# Patient Record
Sex: Female | Born: 1945 | Race: Black or African American | Hispanic: No | Marital: Single
Health system: Southern US, Community
[De-identification: ages and names within clinical notes are randomized; demographics above are authoritative.]

---

## 2004-02-29 ENCOUNTER — Other Ambulatory Visit: Payer: Self-pay

## 2004-03-19 ENCOUNTER — Emergency Department: Payer: Self-pay | Admitting: Emergency Medicine

## 2004-03-19 ENCOUNTER — Other Ambulatory Visit: Payer: Self-pay

## 2004-07-01 ENCOUNTER — Ambulatory Visit: Payer: Self-pay | Admitting: Family Medicine

## 2004-07-02 ENCOUNTER — Ambulatory Visit: Payer: Self-pay | Admitting: *Deleted

## 2004-07-03 ENCOUNTER — Ambulatory Visit: Payer: Self-pay | Admitting: Family Medicine

## 2004-07-14 ENCOUNTER — Ambulatory Visit: Payer: Self-pay | Admitting: Family Medicine

## 2004-07-17 ENCOUNTER — Ambulatory Visit: Payer: Self-pay | Admitting: Family Medicine

## 2004-07-21 ENCOUNTER — Ambulatory Visit: Payer: Self-pay | Admitting: Family Medicine

## 2004-07-30 ENCOUNTER — Ambulatory Visit: Payer: Self-pay | Admitting: Family Medicine

## 2004-08-04 ENCOUNTER — Ambulatory Visit: Payer: Self-pay | Admitting: Family Medicine

## 2004-08-07 ENCOUNTER — Ambulatory Visit: Payer: Self-pay | Admitting: Family Medicine

## 2004-08-12 ENCOUNTER — Emergency Department: Payer: Self-pay | Admitting: Emergency Medicine

## 2004-08-18 ENCOUNTER — Ambulatory Visit: Payer: Self-pay | Admitting: Family Medicine

## 2004-08-21 ENCOUNTER — Ambulatory Visit: Payer: Self-pay | Admitting: Family Medicine

## 2004-08-21 ENCOUNTER — Emergency Department: Payer: Self-pay | Admitting: Emergency Medicine

## 2004-08-25 ENCOUNTER — Encounter: Admission: RE | Admit: 2004-08-25 | Discharge: 2004-10-22 | Payer: Self-pay | Admitting: Family Medicine

## 2004-09-09 ENCOUNTER — Ambulatory Visit: Payer: Self-pay | Admitting: Family Medicine

## 2004-09-11 ENCOUNTER — Ambulatory Visit: Payer: Self-pay | Admitting: Family Medicine

## 2004-09-15 ENCOUNTER — Ambulatory Visit: Payer: Self-pay | Admitting: Family Medicine

## 2004-09-21 ENCOUNTER — Emergency Department: Payer: Self-pay | Admitting: Emergency Medicine

## 2004-09-24 ENCOUNTER — Ambulatory Visit (HOSPITAL_COMMUNITY): Admission: RE | Admit: 2004-09-24 | Discharge: 2004-09-24 | Payer: Self-pay | Admitting: Family Medicine

## 2004-09-24 ENCOUNTER — Ambulatory Visit: Payer: Self-pay | Admitting: Family Medicine

## 2004-09-29 ENCOUNTER — Ambulatory Visit: Payer: Self-pay | Admitting: Family Medicine

## 2004-10-13 ENCOUNTER — Ambulatory Visit: Payer: Self-pay | Admitting: Family Medicine

## 2004-10-23 ENCOUNTER — Ambulatory Visit: Payer: Self-pay | Admitting: Family Medicine

## 2004-10-28 ENCOUNTER — Ambulatory Visit: Payer: Self-pay | Admitting: Family Medicine

## 2004-10-30 ENCOUNTER — Ambulatory Visit: Payer: Self-pay | Admitting: Family Medicine

## 2004-11-10 ENCOUNTER — Encounter: Admission: RE | Admit: 2004-11-10 | Discharge: 2004-12-23 | Payer: Self-pay | Admitting: Family Medicine

## 2004-11-11 ENCOUNTER — Ambulatory Visit: Payer: Self-pay | Admitting: Family Medicine

## 2004-11-19 ENCOUNTER — Emergency Department: Payer: Self-pay | Admitting: Emergency Medicine

## 2004-11-20 ENCOUNTER — Ambulatory Visit: Payer: Self-pay | Admitting: Family Medicine

## 2004-11-25 ENCOUNTER — Ambulatory Visit: Payer: Self-pay | Admitting: Family Medicine

## 2004-12-04 ENCOUNTER — Ambulatory Visit: Payer: Self-pay | Admitting: Family Medicine

## 2004-12-08 ENCOUNTER — Emergency Department: Payer: Self-pay | Admitting: General Practice

## 2004-12-24 ENCOUNTER — Ambulatory Visit: Payer: Self-pay | Admitting: Family Medicine

## 2004-12-25 ENCOUNTER — Ambulatory Visit: Payer: Self-pay | Admitting: Family Medicine

## 2005-01-07 ENCOUNTER — Ambulatory Visit: Payer: Self-pay | Admitting: Family Medicine

## 2005-03-12 ENCOUNTER — Ambulatory Visit: Payer: Self-pay | Admitting: Family Medicine

## 2005-06-22 ENCOUNTER — Emergency Department: Payer: Self-pay | Admitting: Unknown Physician Specialty

## 2005-07-29 ENCOUNTER — Other Ambulatory Visit: Payer: Self-pay

## 2005-07-29 ENCOUNTER — Emergency Department: Payer: Self-pay | Admitting: Emergency Medicine

## 2005-11-29 ENCOUNTER — Inpatient Hospital Stay: Payer: Self-pay | Admitting: Internal Medicine

## 2005-11-29 ENCOUNTER — Other Ambulatory Visit: Payer: Self-pay

## 2005-12-01 ENCOUNTER — Other Ambulatory Visit: Payer: Self-pay

## 2006-05-26 ENCOUNTER — Emergency Department: Payer: Self-pay | Admitting: Emergency Medicine

## 2006-05-26 ENCOUNTER — Other Ambulatory Visit: Payer: Self-pay

## 2006-06-09 ENCOUNTER — Ambulatory Visit: Payer: Self-pay | Admitting: Unknown Physician Specialty

## 2006-06-17 ENCOUNTER — Inpatient Hospital Stay: Payer: Self-pay | Admitting: Internal Medicine

## 2006-06-17 ENCOUNTER — Other Ambulatory Visit: Payer: Self-pay

## 2006-06-28 ENCOUNTER — Emergency Department: Payer: Self-pay | Admitting: Unknown Physician Specialty

## 2006-06-28 ENCOUNTER — Other Ambulatory Visit: Payer: Self-pay

## 2006-10-19 ENCOUNTER — Emergency Department: Payer: Self-pay | Admitting: Emergency Medicine

## 2006-12-13 ENCOUNTER — Emergency Department: Payer: Self-pay | Admitting: Unknown Physician Specialty

## 2006-12-13 ENCOUNTER — Other Ambulatory Visit: Payer: Self-pay

## 2007-02-08 ENCOUNTER — Other Ambulatory Visit: Payer: Self-pay

## 2007-02-08 ENCOUNTER — Inpatient Hospital Stay: Payer: Self-pay | Admitting: Internal Medicine

## 2007-04-26 ENCOUNTER — Emergency Department: Payer: Self-pay | Admitting: Emergency Medicine

## 2007-05-30 ENCOUNTER — Ambulatory Visit: Payer: Self-pay | Admitting: Internal Medicine

## 2007-07-31 ENCOUNTER — Emergency Department: Payer: Self-pay | Admitting: Emergency Medicine

## 2007-11-16 ENCOUNTER — Other Ambulatory Visit: Payer: Self-pay

## 2007-11-16 ENCOUNTER — Emergency Department: Payer: Self-pay | Admitting: Internal Medicine

## 2007-12-02 ENCOUNTER — Emergency Department: Payer: Self-pay | Admitting: Emergency Medicine

## 2007-12-06 ENCOUNTER — Ambulatory Visit: Payer: Self-pay | Admitting: Internal Medicine

## 2008-01-03 ENCOUNTER — Ambulatory Visit: Payer: Self-pay | Admitting: Internal Medicine

## 2008-04-18 ENCOUNTER — Ambulatory Visit: Payer: Self-pay | Admitting: Internal Medicine

## 2008-08-27 ENCOUNTER — Ambulatory Visit: Payer: Self-pay | Admitting: Internal Medicine

## 2008-09-02 ENCOUNTER — Ambulatory Visit: Payer: Self-pay | Admitting: Internal Medicine

## 2008-10-27 ENCOUNTER — Emergency Department: Payer: Self-pay | Admitting: Emergency Medicine

## 2008-11-06 ENCOUNTER — Encounter: Payer: Self-pay | Admitting: Family Medicine

## 2008-11-13 ENCOUNTER — Encounter: Payer: Self-pay | Admitting: Family Medicine

## 2008-12-05 ENCOUNTER — Emergency Department: Payer: Self-pay | Admitting: Emergency Medicine

## 2008-12-12 ENCOUNTER — Encounter: Payer: Self-pay | Admitting: Family Medicine

## 2009-06-25 ENCOUNTER — Ambulatory Visit: Payer: Self-pay | Admitting: Family Medicine

## 2009-10-02 ENCOUNTER — Ambulatory Visit: Payer: Self-pay | Admitting: Family Medicine

## 2009-10-20 ENCOUNTER — Ambulatory Visit: Payer: Self-pay | Admitting: Family Medicine

## 2010-02-02 ENCOUNTER — Encounter: Payer: Self-pay | Admitting: Family Medicine

## 2010-02-12 ENCOUNTER — Encounter: Payer: Self-pay | Admitting: Family Medicine

## 2010-03-14 ENCOUNTER — Encounter: Payer: Self-pay | Admitting: Family Medicine

## 2010-06-11 ENCOUNTER — Ambulatory Visit: Payer: Self-pay | Admitting: Family Medicine

## 2010-10-27 ENCOUNTER — Ambulatory Visit: Payer: Self-pay | Admitting: Emergency Medicine

## 2010-11-03 ENCOUNTER — Ambulatory Visit: Payer: Self-pay | Admitting: Family Medicine

## 2011-06-16 ENCOUNTER — Inpatient Hospital Stay: Payer: Self-pay | Admitting: Internal Medicine

## 2011-06-16 LAB — CK TOTAL AND CKMB (NOT AT ARMC): CK-MB: 0.9 ng/mL (ref 0.5–3.6)

## 2011-06-16 LAB — CBC
HGB: 14 g/dL (ref 12.0–16.0)
MCH: 32 pg (ref 26.0–34.0)
MCHC: 33.7 g/dL (ref 32.0–36.0)
Platelet: 262 10*3/uL (ref 150–440)
RBC: 4.37 10*6/uL (ref 3.80–5.20)

## 2011-06-16 LAB — COMPREHENSIVE METABOLIC PANEL
Anion Gap: 12 (ref 7–16)
Calcium, Total: 9.8 mg/dL (ref 8.5–10.1)
Chloride: 91 mmol/L — ABNORMAL LOW (ref 98–107)
Co2: 27 mmol/L (ref 21–32)
EGFR (African American): 49 — ABNORMAL LOW
EGFR (Non-African Amer.): 40 — ABNORMAL LOW
Potassium: 5.1 mmol/L (ref 3.5–5.1)
SGOT(AST): 85 U/L — ABNORMAL HIGH (ref 15–37)
SGPT (ALT): 34 U/L
Total Protein: 8.5 g/dL — ABNORMAL HIGH (ref 6.4–8.2)

## 2011-06-16 LAB — PROTIME-INR
INR: 1
Prothrombin Time: 13.4 secs (ref 11.5–14.7)

## 2011-06-16 LAB — TROPONIN I: Troponin-I: <0.02 ng/mL

## 2011-06-17 LAB — LIPID PANEL
Cholesterol: 106 mg/dL (ref 0–200)
Ldl Cholesterol, Calc: 28 mg/dL (ref 0–100)
VLDL Cholesterol, Calc: 40 mg/dL (ref 5–40)

## 2011-06-17 LAB — CBC WITH DIFFERENTIAL/PLATELET
Basophil %: 0.5 %
Eosinophil #: 0.2 10*3/uL (ref 0.0–0.7)
HCT: 35.7 % (ref 35.0–47.0)
HGB: 12.1 g/dL (ref 12.0–16.0)
Lymphocyte #: 2 10*3/uL (ref 1.0–3.6)
Lymphocyte %: 30 %
Monocyte #: 0.7 10*3/uL (ref 0.0–0.7)
Monocyte %: 10.5 %
Neutrophil %: 56.6 %
RBC: 3.79 10*6/uL — ABNORMAL LOW (ref 3.80–5.20)

## 2011-06-17 LAB — BASIC METABOLIC PANEL
Anion Gap: 9 (ref 7–16)
BUN: 21 mg/dL — ABNORMAL HIGH (ref 7–18)
Calcium, Total: 9 mg/dL (ref 8.5–10.1)
EGFR (African American): >60
EGFR (Non-African Amer.): 57 — ABNORMAL LOW
Glucose: 69 mg/dL (ref 65–99)
Osmolality: 270 (ref 275–301)
Potassium: 4.5 mmol/L (ref 3.5–5.1)

## 2011-06-17 LAB — HEMOGLOBIN A1C: Hemoglobin A1C: 6.1 % (ref 4.2–6.3)

## 2011-06-18 LAB — LIPASE, BLOOD: Lipase: 238 U/L (ref 73–393)

## 2011-06-19 LAB — COMPREHENSIVE METABOLIC PANEL
Anion Gap: 9 (ref 7–16)
BUN: 4 mg/dL — ABNORMAL LOW (ref 7–18)
Calcium, Total: 9.5 mg/dL (ref 8.5–10.1)
Chloride: 106 mmol/L (ref 98–107)
EGFR (African American): >60
EGFR (Non-African Amer.): >60
Potassium: 4.2 mmol/L (ref 3.5–5.1)
SGOT(AST): 77 U/L — ABNORMAL HIGH (ref 15–37)
SGPT (ALT): 29 U/L

## 2011-06-22 LAB — PROT IMMUNOELECTROPHORES(ARMC)

## 2011-07-01 ENCOUNTER — Inpatient Hospital Stay: Payer: Self-pay | Admitting: *Deleted

## 2011-07-01 LAB — CBC
Platelet: 214 10*3/uL (ref 150–440)
RDW: 13.7 % (ref 11.5–14.5)

## 2011-07-01 LAB — COMPREHENSIVE METABOLIC PANEL
Albumin: 3.9 g/dL (ref 3.4–5.0)
Anion Gap: 15 (ref 7–16)
BUN: 19 mg/dL — ABNORMAL HIGH (ref 7–18)
Bilirubin,Total: 0.5 mg/dL (ref 0.2–1.0)
Chloride: 97 mmol/L — ABNORMAL LOW (ref 98–107)
Creatinine: 1.41 mg/dL — ABNORMAL HIGH (ref 0.60–1.30)
EGFR (African American): 48 — ABNORMAL LOW
Glucose: 66 mg/dL (ref 65–99)
Osmolality: 274 (ref 275–301)
Potassium: 5.1 mmol/L (ref 3.5–5.1)
Sodium: 137 mmol/L (ref 136–145)
Total Protein: 7.4 g/dL (ref 6.4–8.2)

## 2011-07-01 LAB — URINALYSIS, COMPLETE
Bilirubin,UR: NEGATIVE
Hyaline Cast: 27
Ketone: NEGATIVE
Ph: 5 (ref 4.5–8.0)
RBC,UR: <1 /HPF (ref 0–5)
Specific Gravity: 1.009 (ref 1.003–1.030)
Squamous Epithelial: 1
WBC UR: <1 /HPF (ref 0–5)

## 2011-07-02 LAB — BASIC METABOLIC PANEL
Anion Gap: 10 (ref 7–16)
Calcium, Total: 9.3 mg/dL (ref 8.5–10.1)
Chloride: 99 mmol/L (ref 98–107)
Co2: 28 mmol/L (ref 21–32)
EGFR (African American): >60

## 2011-07-02 LAB — TROPONIN I: Troponin-I: <0.02 ng/mL

## 2011-07-03 LAB — BASIC METABOLIC PANEL
Anion Gap: 14 (ref 7–16)
BUN: 11 mg/dL (ref 7–18)
Co2: 24 mmol/L (ref 21–32)
Creatinine: 1.04 mg/dL (ref 0.60–1.30)
EGFR (African American): >60
Osmolality: 272 (ref 275–301)

## 2011-07-04 LAB — COMPREHENSIVE METABOLIC PANEL
Anion Gap: 14 (ref 7–16)
BUN: 10 mg/dL (ref 7–18)
Bilirubin,Total: 0.4 mg/dL (ref 0.2–1.0)
Chloride: 100 mmol/L (ref 98–107)
Creatinine: 1.07 mg/dL (ref 0.60–1.30)
EGFR (African American): >60
Potassium: 5 mmol/L (ref 3.5–5.1)
Total Protein: 6.8 g/dL (ref 6.4–8.2)

## 2011-07-05 LAB — CBC WITH DIFFERENTIAL/PLATELET
Basophil #: 0 10*3/uL (ref 0.0–0.1)
Eosinophil %: 1.7 %
HCT: 35 % (ref 35.0–47.0)
Lymphocyte %: 18.4 %
Monocyte %: 8.6 %
Platelet: 199 10*3/uL (ref 150–440)
RBC: 3.63 10*6/uL — ABNORMAL LOW (ref 3.80–5.20)
RDW: 13.5 % (ref 11.5–14.5)
WBC: 7.9 10*3/uL (ref 3.6–11.0)

## 2011-07-05 LAB — BASIC METABOLIC PANEL
Calcium, Total: 9.6 mg/dL (ref 8.5–10.1)
Chloride: 97 mmol/L — ABNORMAL LOW (ref 98–107)
EGFR (African American): >60
EGFR (Non-African Amer.): >60
Glucose: 77 mg/dL (ref 65–99)
Potassium: 4.5 mmol/L (ref 3.5–5.1)

## 2011-07-12 ENCOUNTER — Ambulatory Visit: Payer: Self-pay | Admitting: Physician Assistant

## 2011-10-13 ENCOUNTER — Ambulatory Visit: Payer: Self-pay | Admitting: Internal Medicine

## 2011-10-24 LAB — CBC
HCT: 40.8 % (ref 35.0–47.0)
MCH: 33.6 pg (ref 26.0–34.0)
MCHC: 33.5 g/dL (ref 32.0–36.0)
Platelet: 234 10*3/uL (ref 150–440)
RBC: 4.07 10*6/uL (ref 3.80–5.20)

## 2011-10-24 LAB — COMPREHENSIVE METABOLIC PANEL
Anion Gap: 16 (ref 7–16)
BUN: 19 mg/dL — ABNORMAL HIGH (ref 7–18)
Bilirubin,Total: 0.6 mg/dL (ref 0.2–1.0)
Co2: 20 mmol/L — ABNORMAL LOW (ref 21–32)
EGFR (Non-African Amer.): >60
Osmolality: 237 (ref 275–301)
SGOT(AST): 117 U/L — ABNORMAL HIGH (ref 15–37)
SGPT (ALT): 27 U/L
Sodium: 116 mmol/L — CL (ref 136–145)

## 2011-10-24 LAB — URINALYSIS, COMPLETE
Bilirubin,UR: NEGATIVE
Glucose,UR: NEGATIVE mg/dL (ref 0–75)
Ketone: NEGATIVE
Nitrite: NEGATIVE
Protein: NEGATIVE
WBC UR: 1 /HPF (ref 0–5)

## 2011-10-24 LAB — PROTIME-INR
INR: 1
Prothrombin Time: 13.5 secs (ref 11.5–14.7)

## 2011-10-25 ENCOUNTER — Inpatient Hospital Stay: Payer: Self-pay | Admitting: Internal Medicine

## 2011-10-25 LAB — URINALYSIS, COMPLETE
Bilirubin,UR: NEGATIVE
Blood: NEGATIVE
Glucose,UR: NEGATIVE mg/dL (ref 0–75)
Ketone: NEGATIVE
Nitrite: NEGATIVE
Protein: NEGATIVE

## 2011-10-25 LAB — CK TOTAL AND CKMB (NOT AT ARMC)
CK, Total: 65 U/L (ref 21–215)
CK, Total: 88 U/L (ref 21–215)
CK-MB: 1.2 ng/mL (ref 0.5–3.6)
CK-MB: 1.6 ng/mL (ref 0.5–3.6)

## 2011-10-25 LAB — PROTEIN, URINE, RANDOM: Protein, Random Urine: <5 mg/dL — ABNORMAL LOW (ref 0–12)

## 2011-10-25 LAB — CREATININE, URINE, RANDOM: Creatinine, Urine Random: 20.1 mg/dL — ABNORMAL LOW (ref 30.0–125.0)

## 2011-10-25 LAB — SODIUM: Sodium: 116 mmol/L — CL (ref 136–145)

## 2011-10-26 LAB — MAGNESIUM: Magnesium: 0.9 mg/dL — ABNORMAL LOW

## 2011-10-26 LAB — CBC WITH DIFFERENTIAL/PLATELET
Basophil %: 0.7 %
Eosinophil %: 3 %
HCT: 37.7 % (ref 35.0–47.0)
HGB: 12.6 g/dL (ref 12.0–16.0)
Lymphocyte #: 1 10*3/uL (ref 1.0–3.6)
MCH: 33.5 pg (ref 26.0–34.0)
Monocyte #: 0.6 x10 3/mm (ref 0.2–0.9)
Neutrophil #: 2.6 10*3/uL (ref 1.4–6.5)
RBC: 3.77 10*6/uL — ABNORMAL LOW (ref 3.80–5.20)

## 2011-10-26 LAB — COMPREHENSIVE METABOLIC PANEL
Alkaline Phosphatase: 154 U/L — ABNORMAL HIGH (ref 50–136)
Anion Gap: 16 (ref 7–16)
BUN: 16 mg/dL (ref 7–18)
Bilirubin,Total: 0.7 mg/dL (ref 0.2–1.0)
Calcium, Total: 8.5 mg/dL (ref 8.5–10.1)
Chloride: 84 mmol/L — ABNORMAL LOW (ref 98–107)
Co2: 21 mmol/L (ref 21–32)
EGFR (Non-African Amer.): >60
Potassium: 4.7 mmol/L (ref 3.5–5.1)
SGPT (ALT): 27 U/L
Sodium: 121 mmol/L — ABNORMAL LOW (ref 136–145)

## 2011-10-26 LAB — LIPID PANEL
Cholesterol: 117 mg/dL (ref 0–200)
HDL Cholesterol: 53 mg/dL (ref 40–60)
Ldl Cholesterol, Calc: 30 mg/dL (ref 0–100)
Triglycerides: 172 mg/dL (ref 0–200)
VLDL Cholesterol, Calc: 34 mg/dL (ref 5–40)

## 2011-10-26 LAB — LIPASE, BLOOD: Lipase: 2260 U/L — ABNORMAL HIGH (ref 73–393)

## 2011-10-26 LAB — HEMOGLOBIN A1C: Hemoglobin A1C: 4.5 % (ref 4.2–6.3)

## 2011-10-27 LAB — BASIC METABOLIC PANEL
Calcium, Total: 8.5 mg/dL (ref 8.5–10.1)
Chloride: 86 mmol/L — ABNORMAL LOW (ref 98–107)
EGFR (African American): >60
EGFR (Non-African Amer.): >60
Glucose: 44 mg/dL — ABNORMAL LOW (ref 65–99)
Osmolality: 243 (ref 275–301)
Potassium: 5.2 mmol/L — ABNORMAL HIGH (ref 3.5–5.1)

## 2011-10-27 LAB — LIPASE, BLOOD: Lipase: 1134 U/L — ABNORMAL HIGH (ref 73–393)

## 2011-10-28 LAB — BASIC METABOLIC PANEL
BUN: 11 mg/dL (ref 7–18)
Chloride: 84 mmol/L — ABNORMAL LOW (ref 98–107)
EGFR (African American): >60
Potassium: 5.1 mmol/L (ref 3.5–5.1)

## 2011-10-28 LAB — MAGNESIUM: Magnesium: 1.6 mg/dL — ABNORMAL LOW

## 2011-10-29 LAB — BASIC METABOLIC PANEL
Anion Gap: 18 — ABNORMAL HIGH (ref 7–16)
BUN: 9 mg/dL (ref 7–18)
Chloride: 85 mmol/L — ABNORMAL LOW (ref 98–107)
Creatinine: 0.74 mg/dL (ref 0.60–1.30)
EGFR (Non-African Amer.): >60
Osmolality: 242 (ref 275–301)
Potassium: 5.4 mmol/L — ABNORMAL HIGH (ref 3.5–5.1)
Sodium: 121 mmol/L — ABNORMAL LOW (ref 136–145)

## 2011-10-29 LAB — MAGNESIUM: Magnesium: 1.6 mg/dL — ABNORMAL LOW

## 2011-10-30 LAB — BASIC METABOLIC PANEL
Anion Gap: 17 — ABNORMAL HIGH (ref 7–16)
BUN: 9 mg/dL (ref 7–18)
Chloride: 86 mmol/L — ABNORMAL LOW (ref 98–107)
Co2: 19 mmol/L — ABNORMAL LOW (ref 21–32)
Creatinine: 0.81 mg/dL (ref 0.60–1.30)
EGFR (African American): >60
Glucose: 58 mg/dL — ABNORMAL LOW (ref 65–99)
Osmolality: 242 (ref 275–301)

## 2011-10-30 LAB — MAGNESIUM: Magnesium: 1.5 mg/dL — ABNORMAL LOW

## 2011-10-31 LAB — MAGNESIUM: Magnesium: 1.1 mg/dL — ABNORMAL LOW

## 2011-10-31 LAB — BASIC METABOLIC PANEL
BUN: 8 mg/dL (ref 7–18)
Chloride: 86 mmol/L — ABNORMAL LOW (ref 98–107)
EGFR (African American): >60
EGFR (Non-African Amer.): >60
Glucose: 51 mg/dL — ABNORMAL LOW (ref 65–99)

## 2011-10-31 LAB — POTASSIUM: Potassium: 5 mmol/L (ref 3.5–5.1)

## 2011-10-31 LAB — HEMOGLOBIN: HGB: 11.5 g/dL — ABNORMAL LOW (ref 12.0–16.0)

## 2011-11-12 ENCOUNTER — Inpatient Hospital Stay: Payer: Self-pay | Admitting: Internal Medicine

## 2011-11-12 LAB — PROTIME-INR: INR: 1

## 2011-11-12 LAB — CBC
MCHC: 33.8 g/dL (ref 32.0–36.0)
RBC: 3.33 10*6/uL — ABNORMAL LOW (ref 3.80–5.20)
RDW: 14.7 % — ABNORMAL HIGH (ref 11.5–14.5)
WBC: 7.1 10*3/uL (ref 3.6–11.0)

## 2011-11-12 LAB — COMPREHENSIVE METABOLIC PANEL
Alkaline Phosphatase: 157 U/L — ABNORMAL HIGH (ref 50–136)
Anion Gap: 18 — ABNORMAL HIGH (ref 7–16)
BUN: 26 mg/dL — ABNORMAL HIGH (ref 7–18)
Bilirubin,Total: 0.6 mg/dL (ref 0.2–1.0)
Calcium, Total: 10 mg/dL (ref 8.5–10.1)
Chloride: 83 mmol/L — ABNORMAL LOW (ref 98–107)
EGFR (African American): >60
EGFR (Non-African Amer.): 55 — ABNORMAL LOW
Glucose: 103 mg/dL — ABNORMAL HIGH (ref 65–99)
Osmolality: 253 (ref 275–301)
Sodium: 123 mmol/L — ABNORMAL LOW (ref 136–145)
Total Protein: 7.3 g/dL (ref 6.4–8.2)

## 2011-11-13 ENCOUNTER — Ambulatory Visit: Payer: Self-pay | Admitting: Internal Medicine

## 2011-11-13 LAB — CBC WITH DIFFERENTIAL/PLATELET
Basophil #: 0.1 10*3/uL (ref 0.0–0.1)
Basophil %: 1.2 %
Eosinophil %: 7.2 %
HCT: 35.8 % (ref 35.0–47.0)
HGB: 11.9 g/dL — ABNORMAL LOW (ref 12.0–16.0)
Lymphocyte #: 1.1 10*3/uL (ref 1.0–3.6)
MCH: 33.7 pg (ref 26.0–34.0)
MCHC: 33.2 g/dL (ref 32.0–36.0)
MCV: 101 fL — ABNORMAL HIGH (ref 80–100)
Monocyte %: 10.8 %
Neutrophil #: 4.2 10*3/uL (ref 1.4–6.5)
RBC: 3.54 10*6/uL — ABNORMAL LOW (ref 3.80–5.20)
WBC: 6.5 10*3/uL (ref 3.6–11.0)

## 2011-11-13 LAB — BASIC METABOLIC PANEL
Calcium, Total: 9.9 mg/dL (ref 8.5–10.1)
Chloride: 84 mmol/L — ABNORMAL LOW (ref 98–107)
Co2: 22 mmol/L (ref 21–32)
EGFR (African American): 59 — ABNORMAL LOW
EGFR (Non-African Amer.): 51 — ABNORMAL LOW
Osmolality: 255 (ref 275–301)
Potassium: 4 mmol/L (ref 3.5–5.1)

## 2011-11-13 LAB — LIPASE, BLOOD: Lipase: 2536 U/L — ABNORMAL HIGH (ref 73–393)

## 2011-11-14 LAB — CBC WITH DIFFERENTIAL/PLATELET
Basophil #: 0.1 10*3/uL (ref 0.0–0.1)
Basophil %: 1.2 %
Eosinophil %: 6.4 %
Lymphocyte #: 1.1 10*3/uL (ref 1.0–3.6)
Lymphocyte %: 18.9 %
MCH: 33.6 pg (ref 26.0–34.0)
MCHC: 33.6 g/dL (ref 32.0–36.0)
MCV: 100 fL (ref 80–100)
Neutrophil #: 3.8 10*3/uL (ref 1.4–6.5)
Neutrophil %: 62.4 %
RBC: 3.23 10*6/uL — ABNORMAL LOW (ref 3.80–5.20)
RDW: 14.5 % (ref 11.5–14.5)

## 2011-11-14 LAB — COMPREHENSIVE METABOLIC PANEL
Albumin: 3.1 g/dL — ABNORMAL LOW (ref 3.4–5.0)
Anion Gap: 17 — ABNORMAL HIGH (ref 7–16)
BUN: 25 mg/dL — ABNORMAL HIGH (ref 7–18)
Bilirubin,Total: 0.6 mg/dL (ref 0.2–1.0)
Chloride: 84 mmol/L — ABNORMAL LOW (ref 98–107)
Co2: 21 mmol/L (ref 21–32)
Creatinine: 0.91 mg/dL (ref 0.60–1.30)
EGFR (African American): >60
Osmolality: 248 (ref 275–301)
Potassium: 4.3 mmol/L (ref 3.5–5.1)
SGOT(AST): 126 U/L — ABNORMAL HIGH (ref 15–37)
SGPT (ALT): 27 U/L
Sodium: 122 mmol/L — ABNORMAL LOW (ref 136–145)

## 2011-11-14 LAB — HEMOGLOBIN A1C: Hemoglobin A1C: 4.2 % (ref 4.2–6.3)

## 2011-11-15 LAB — VANCOMYCIN, TROUGH: Vancomycin, Trough: 17 ug/mL (ref 10–20)

## 2011-11-15 LAB — BASIC METABOLIC PANEL
Calcium, Total: 10.1 mg/dL (ref 8.5–10.1)
Co2: 21 mmol/L (ref 21–32)
Creatinine: 0.87 mg/dL (ref 0.60–1.30)
EGFR (African American): >60
Osmolality: 250 (ref 275–301)
Sodium: 124 mmol/L — ABNORMAL LOW (ref 136–145)

## 2011-11-17 LAB — BASIC METABOLIC PANEL
BUN: 18 mg/dL (ref 7–18)
Calcium, Total: 10.5 mg/dL — ABNORMAL HIGH (ref 8.5–10.1)
Chloride: 85 mmol/L — ABNORMAL LOW (ref 98–107)
Co2: 24 mmol/L (ref 21–32)
Creatinine: 0.93 mg/dL (ref 0.60–1.30)
EGFR (African American): >60
EGFR (Non-African Amer.): >60
Glucose: 59 mg/dL — ABNORMAL LOW (ref 65–99)
Osmolality: 251 (ref 275–301)
Potassium: 4.4 mmol/L (ref 3.5–5.1)
Sodium: 125 mmol/L — ABNORMAL LOW (ref 136–145)

## 2011-11-18 LAB — VANCOMYCIN, TROUGH: Vancomycin, Trough: 22 ug/mL (ref 10–20)

## 2011-11-18 LAB — CULTURE, BLOOD (SINGLE)

## 2012-01-13 DEATH — deceased

## 2012-12-08 IMAGING — CT CT ABD-PELV W/ CM
1 of 3 series · 13 of 32 positions shown, 18 images · non-contrast
Comparison: none

REASON FOR EXAM: (1) Abdominal pain; (2) Mesenteric ischemia
COMMENTS:

[Series 2: 3mm soft tissue · axial · 0.78mm/px · z∈[-950,-581]mm · 13 of 141 slices shown, 18 images]
[im 9/141  soft-tissue]
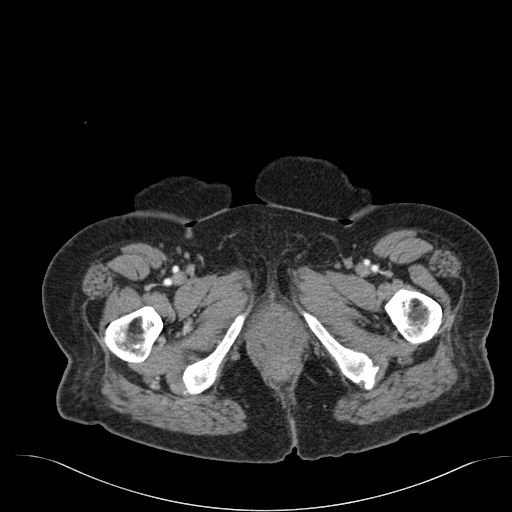
[im 9/141  bone]
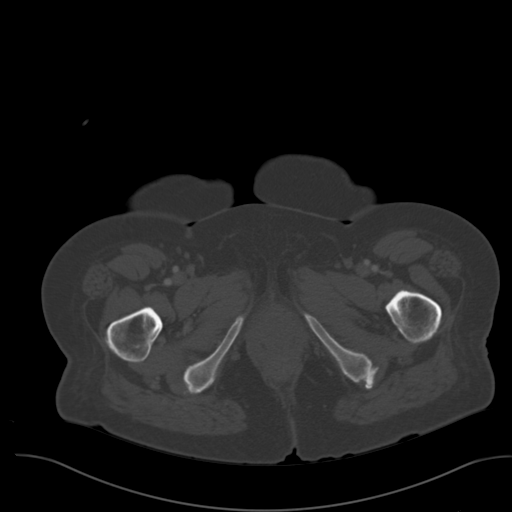
[im 25/141  soft-tissue]
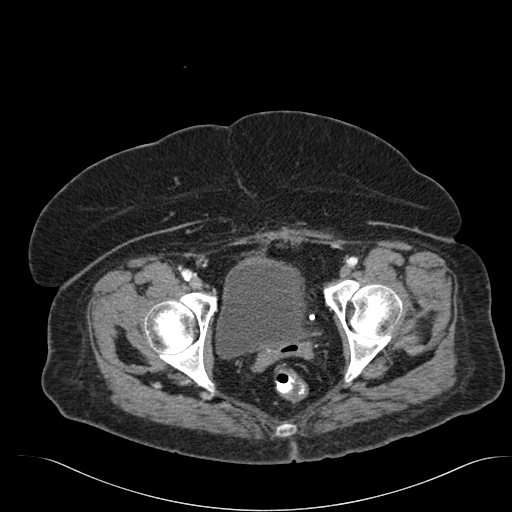
[im 33/141  soft-tissue]
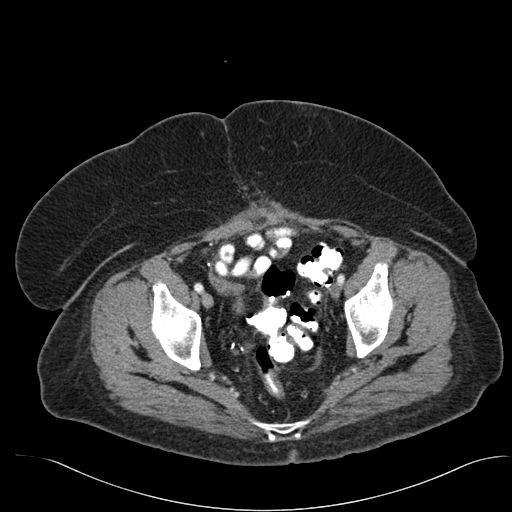
[im 42/141  soft-tissue]
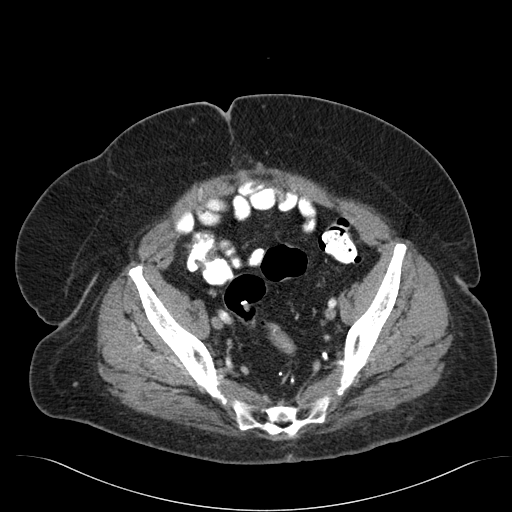
[im 58/141  soft-tissue]
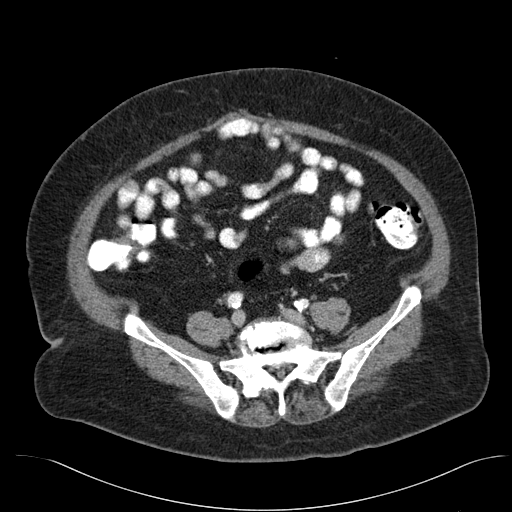
[im 66/141  soft-tissue]
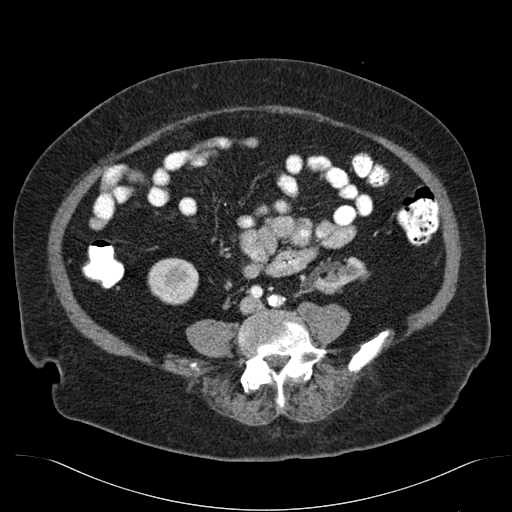
[im 75/141  soft-tissue]
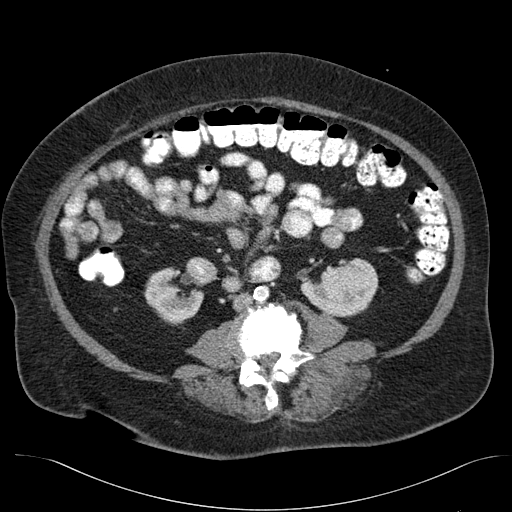
[im 91/141  soft-tissue]
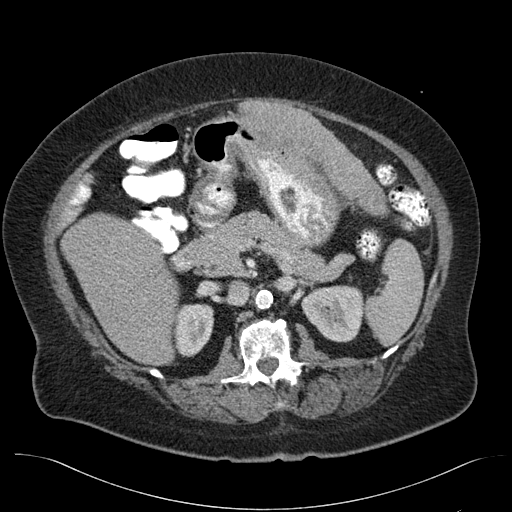
[im 99/141  soft-tissue]
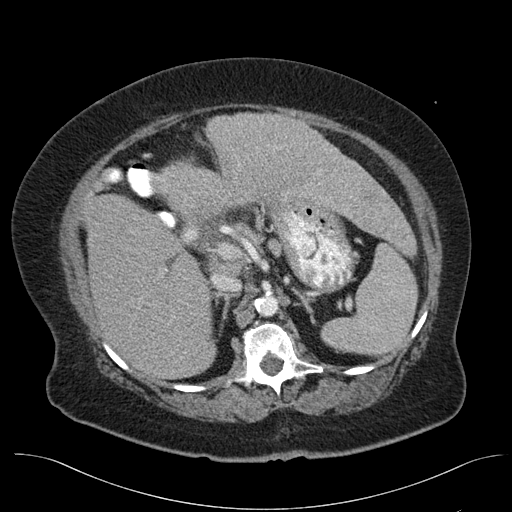
[im 99/141  bone]
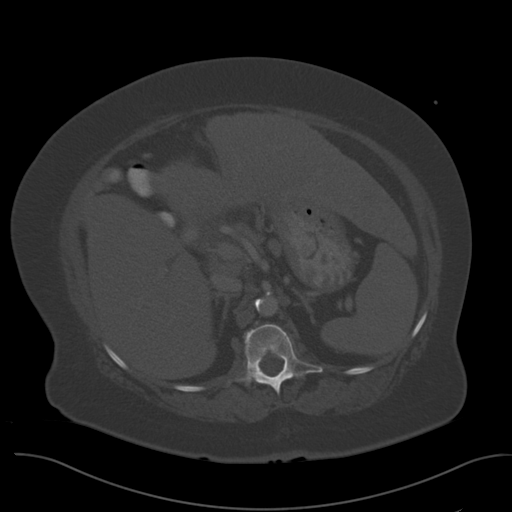
[im 108/141  soft-tissue]
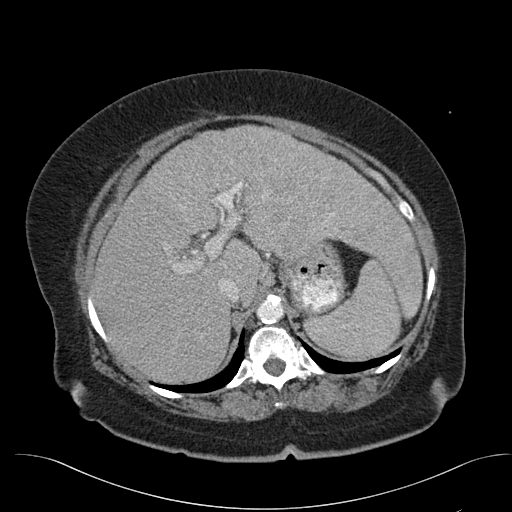
[im 108/141  lung]
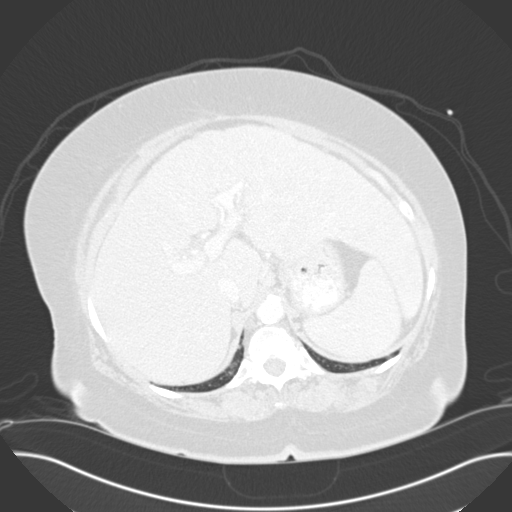
[im 116/141  lung]
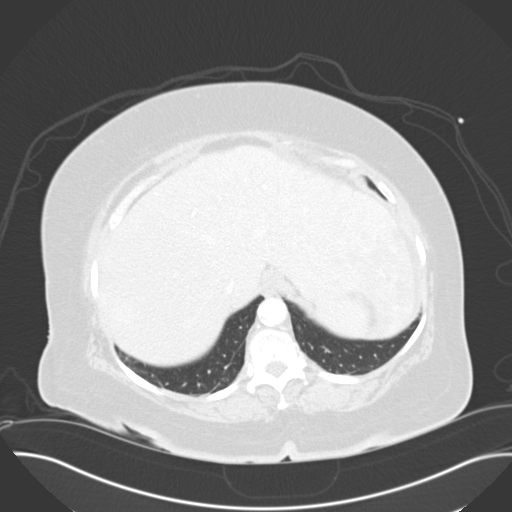
[im 124/141  soft-tissue]
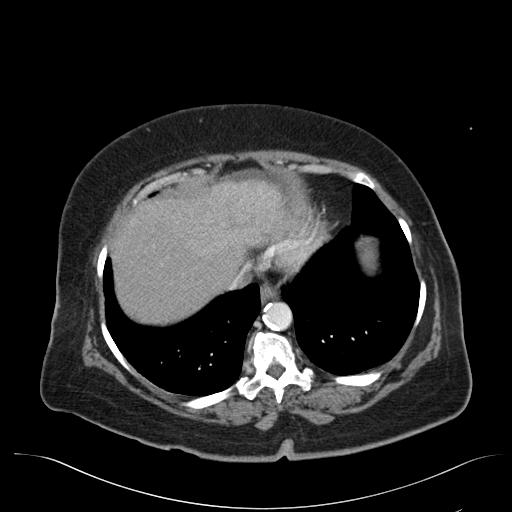
[im 124/141  lung]
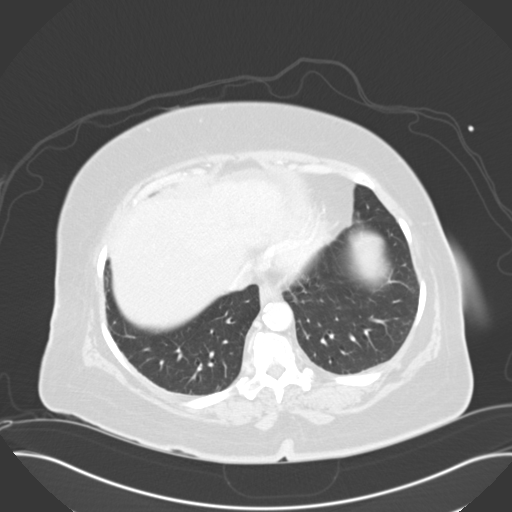
[im 132/141  soft-tissue]
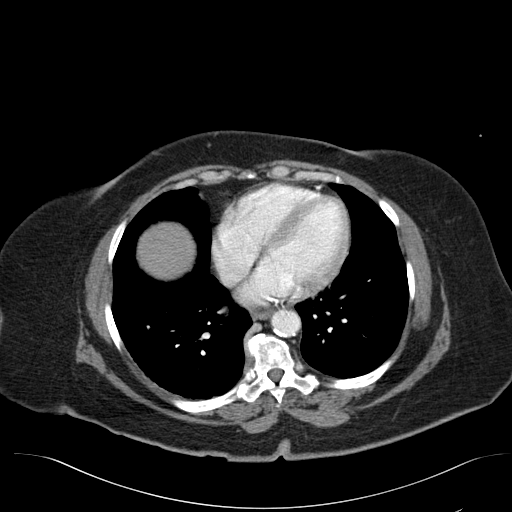
[im 132/141  lung]
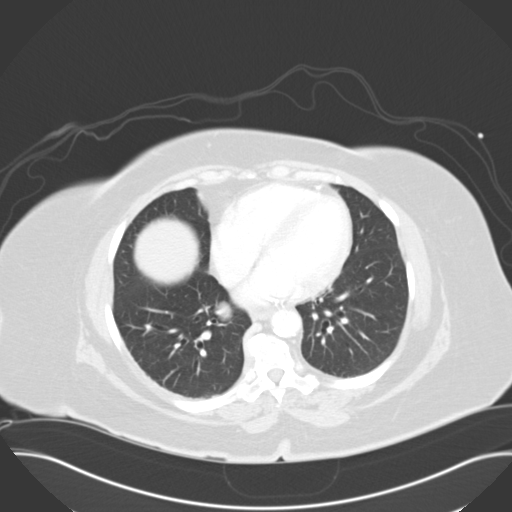

[13 of 32 positions shown; findings below may reference images not displayed]

PROCEDURE:     CT  - CT ABDOMEN / PELVIS  W  - June 19, 2011  [DATE]

RESULT:     CT of the abdomen and pelvis is performed utilizing 100 mL of
[id number]-K87 iodinated intravenous contrast. The patient received oral
contrast for the exam. Study does not demonstrate significant abnormal bowel
distention or bowel wall thickening. There is a heterogeneous appearance of
the left lobe of the liver superiorly on the initial images which is not
seen on the postcontrast delayed images which are more of an equilibrium
phase and portal venous phase study. To CTA technique is not utilized during
the initial injection. There is significant atherosclerotic calcification
causing severe stenosis at the aortic bifurcation and a high degree of
stenosis in the proximal common iliac artery normal left and at the origin
of the right common iliac. Prominent atherosclerotic calcification is noted
at the origin of the superior mesenteric artery causing evidence of
narrowing. Catheter directed arteriography may be beneficial. Vascular
interventional surgical consultation is suggested. There is some
atherosclerotic narrowing at the origin of the celiac artery as well,
however, this appears to be less significant and less advanced.
Atherosclerotic calcification is noted at the origin of the right renal
artery with findings concerning for stenosis. Less prominent origin
atherosclerotic calcification is seen at the origin of the left renal
artery. Degenerative changes are seen in the lumbar spine especially toward
the left. The kidneys show no discrete mass or obstruction. There does not
appear to be significant peripancreatic inflammatory stranding area there is
evidence of hepatic steatosis. The spleen is unremarkable. The adrenal
glands appear normal. No adenopathy, abnormal bowel distention or bowel wall
thickening is evident. Fat filled umbilical hernia is present. The urinary
bladder is unremarkable. Scattered sigmoid colon diverticula disease is
demonstrated. The appendix appears normal.
IMPRESSION: 1. Abnormal appearance of the left lobe of the liver on the initial images.
This is not verified on the delayed postcontrast images. Differential
considerations include hemangioma, underlying infiltrative process, although
that is felt to be somewhat less likely and asymmetric hepatic steatosis
with some areas of focal fatty sparing area there is a punctate calcific
density in the liver on image 27 which was present previously. Given the
appearance of previous study from [DATE] it is felt that there is
likely an underlying hemangioma in the subdiaphragmatic left lobe of the
liver. Ultrasound or MRI correlation is recommended.
2. No definite CT evidence of acute pancreatitis.
3. Significant atherosclerotic disease with findings concerning for common
iliac stenosis, distal aortic stenosis and superior mesenteric artery
stenosis. Right renal artery origin stenosis may also be present. Vascular
stent original surgical consultation is suggested.
4. Small fat filled periumbilical hernia.
5. The lung bases appear clear.

## 2013-04-14 IMAGING — CR DG CHEST 1V PORT
1 series · 1 of 1 positions shown · non-contrast
Comparison: none

REASON FOR EXAM: cp
COMMENTS:

[ap]
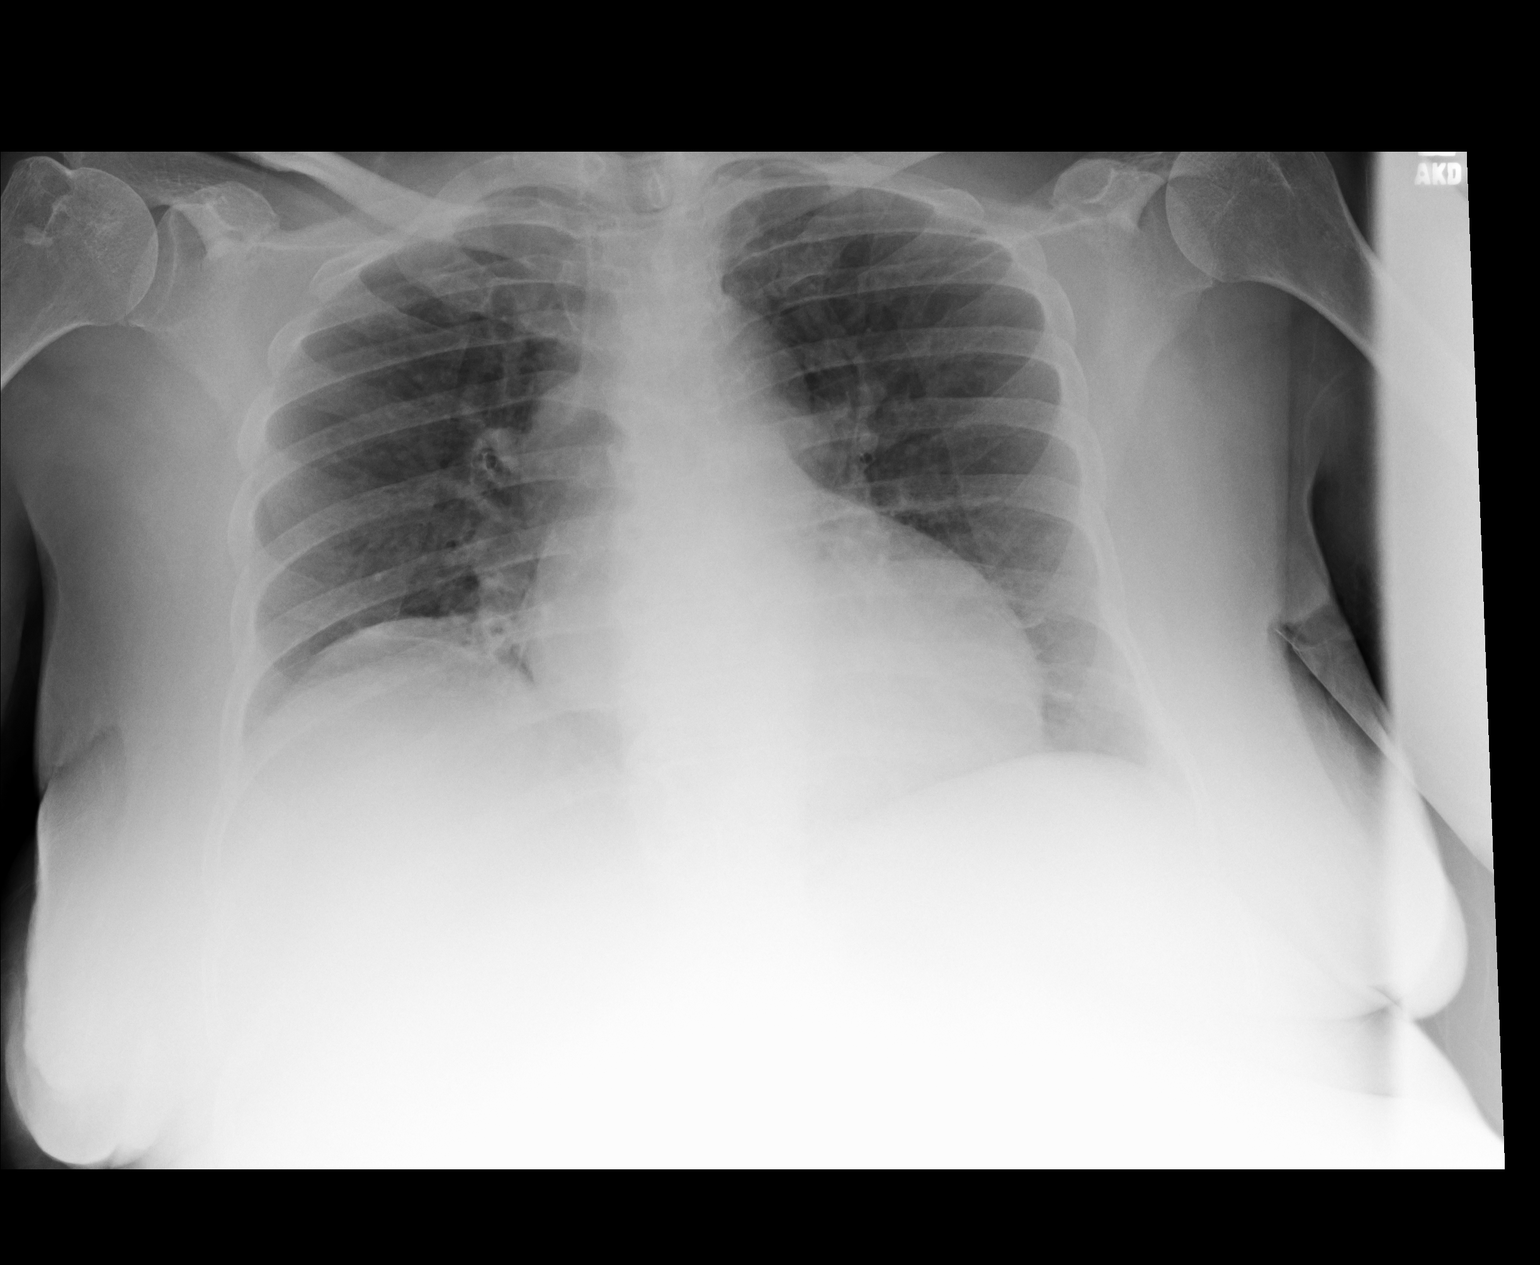

[1 of 1 positions shown; findings below may reference images not displayed]

PROCEDURE:     DXR - DXR PORTABLE CHEST SINGLE VIEW  - October 24, 2011  [DATE]

RESULT:     Comparison made to study July 01, 2011.

The right lung is mildly hypoinflated. Minimal linear density above the
hemidiaphragm is present but not entirely new. The left lung is clear. The
cardiac silhouette is mildly enlarged. The pulmonary vascularity is not
engorged.
IMPRESSION: There is atelectasis versus scarring at the right lung base
with chronic mild elevation of the hemidiaphragm. Otherwise I do not see
acute abnormality. A followup PA and lateral chest x-ray would be of value.

## 2013-04-15 IMAGING — US ABDOMEN ULTRASOUND
1 series · 17 of 25 positions shown · non-contrast
Comparison: none

REASON FOR EXAM: Abd pain/Pancreatitis
COMMENTS:

[Series 1: abdomen ultrasound · 17 of 82 slices shown]
[im 1/82]
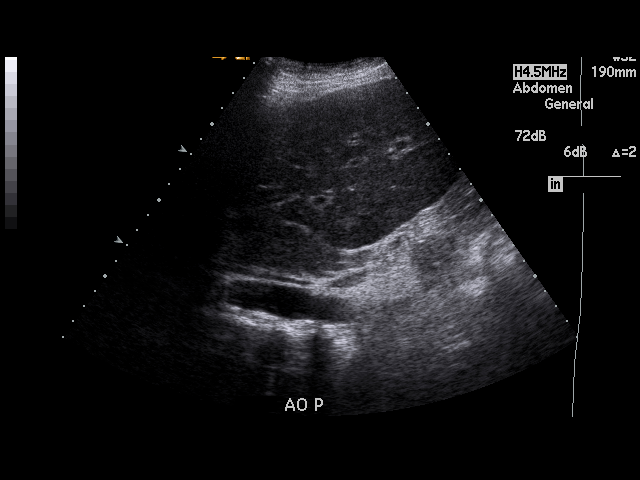
[im 7/82]
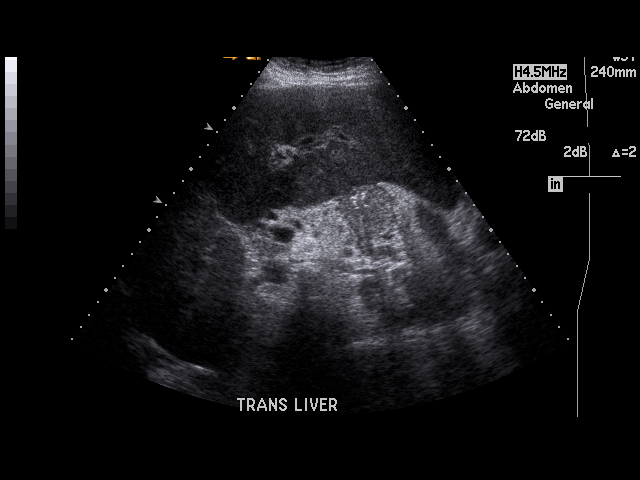
[im 11/82]
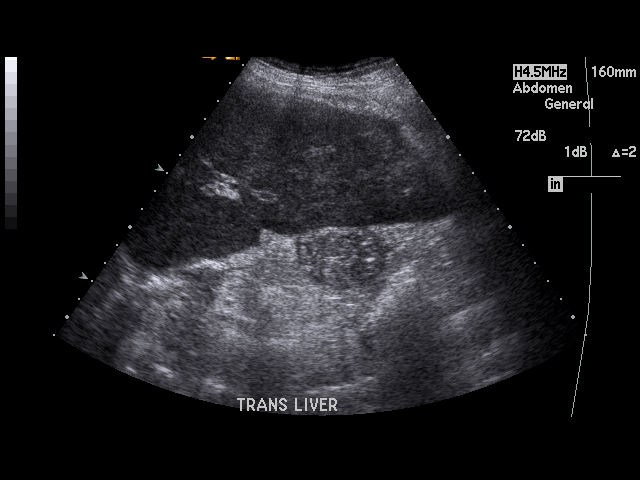
[im 17/82]
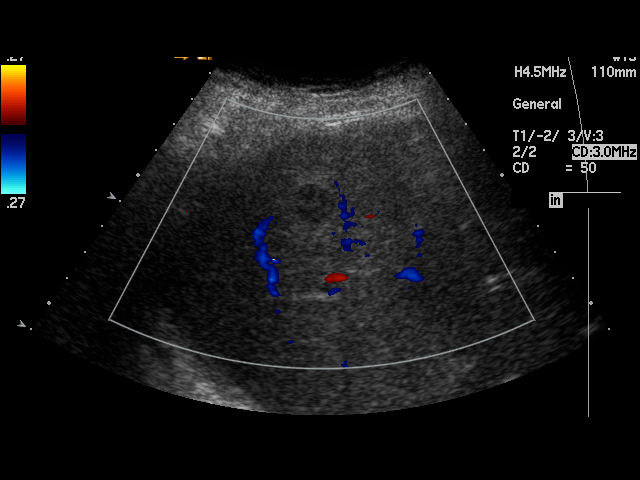
[im 21/82]
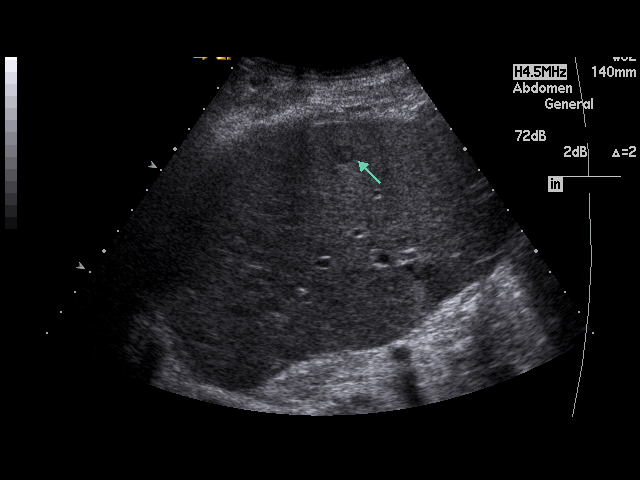
[im 28/82]
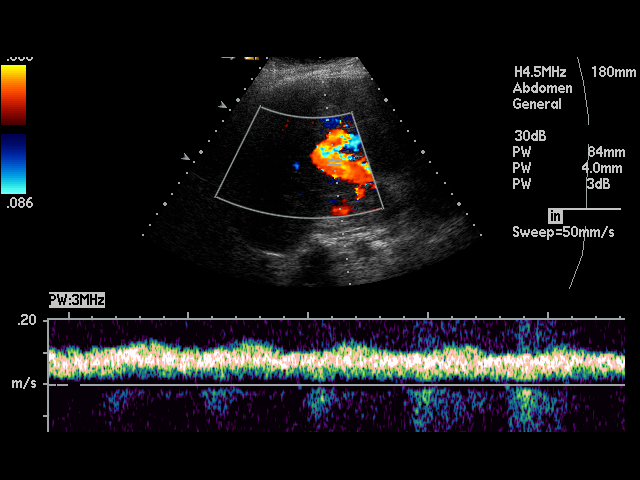
[im 31/82]
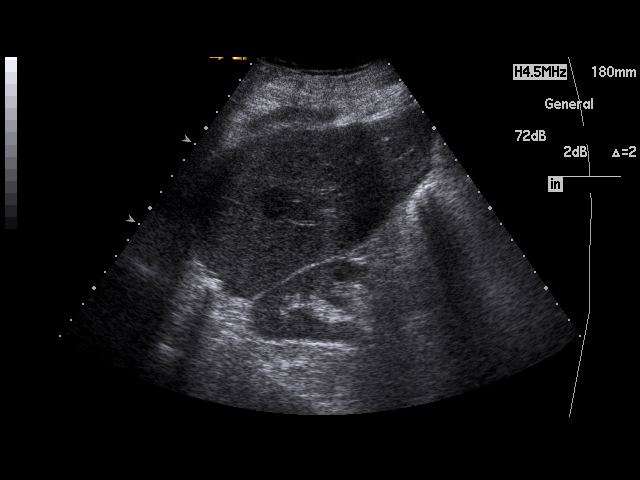
[im 38/82]
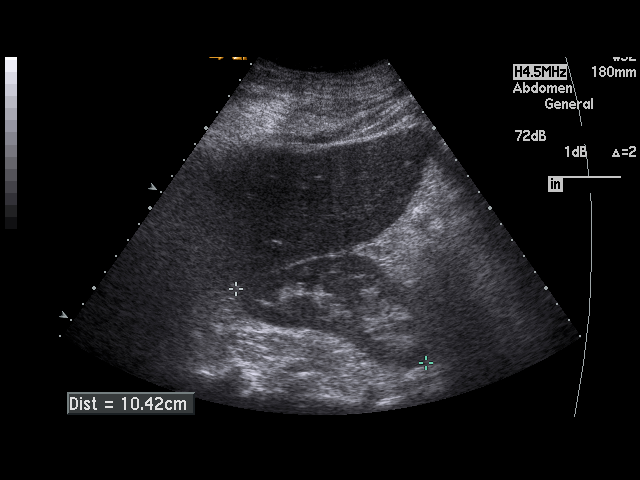
[im 41/82]
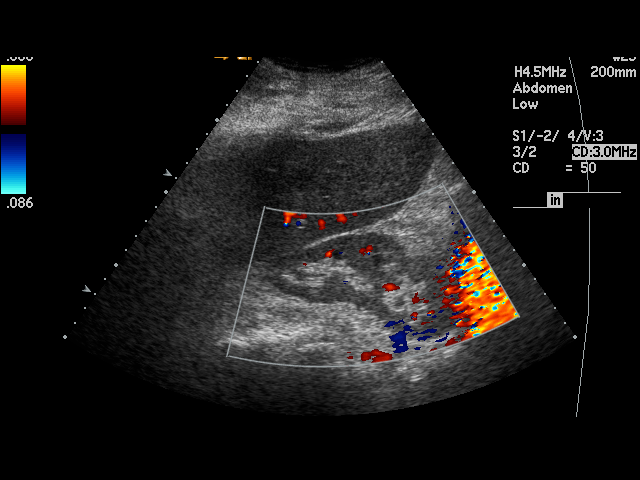
[im 44/82]
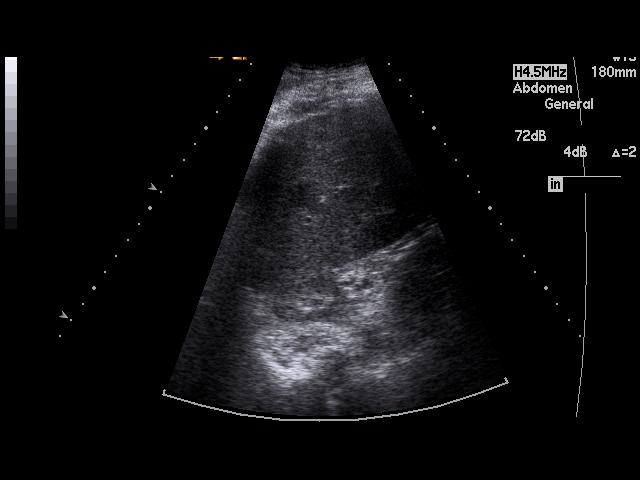
[im 51/82]
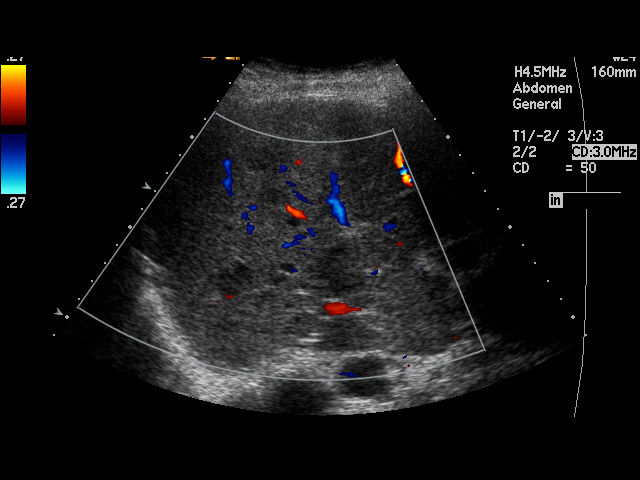
[im 55/82]
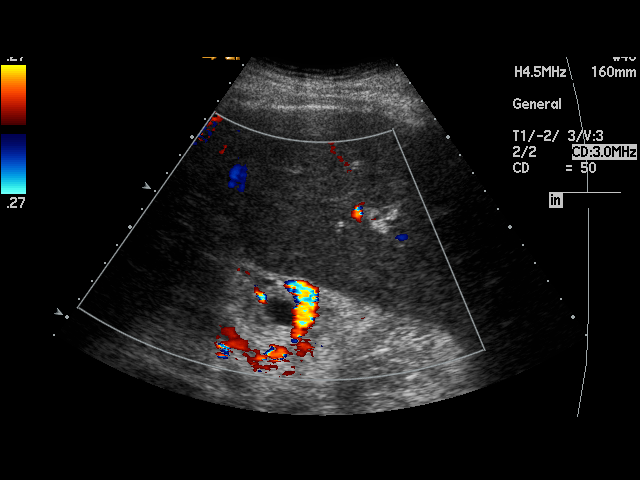
[im 61/82]
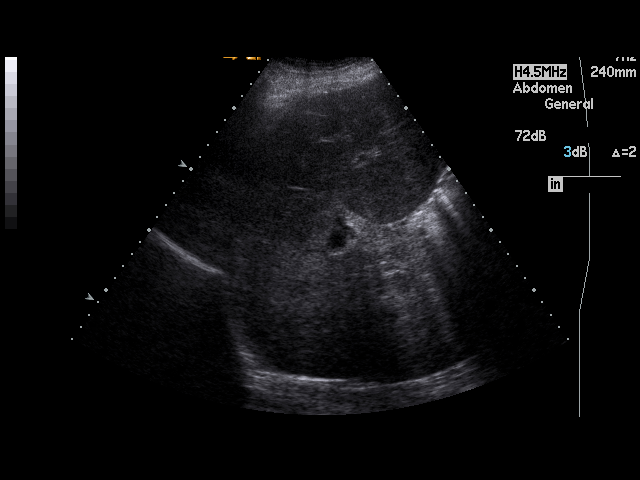
[im 65/82]
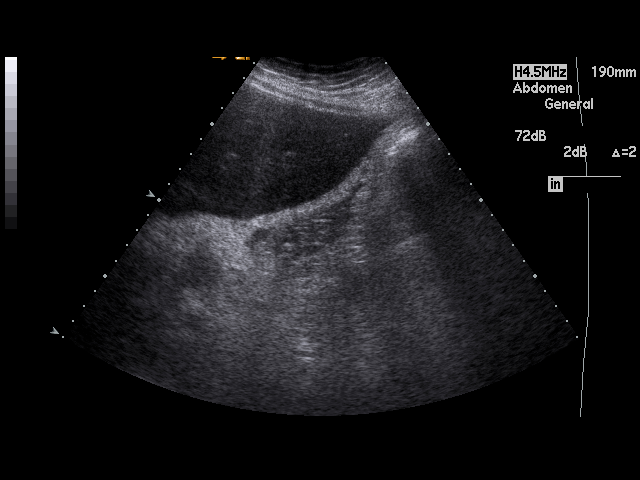
[im 71/82]
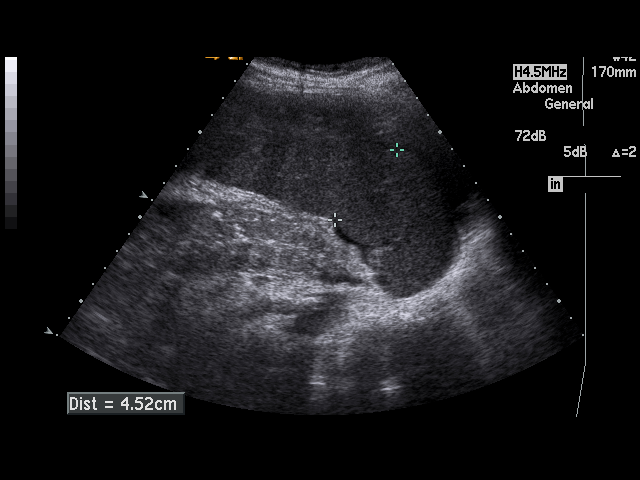
[im 75/82]
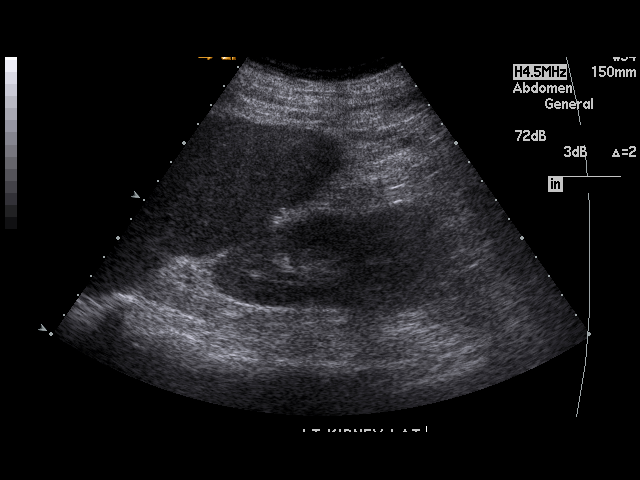
[im 82/82]
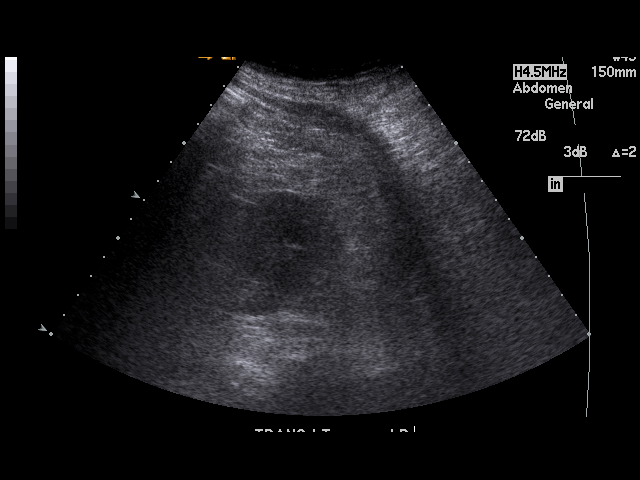

[17 of 25 positions shown; findings below may reference images not displayed]

PROCEDURE:     US  - US ABDOMEN GENERAL SURVEY  - October 25, 2011  [DATE]

RESULT:     Comparison is made to a prior exam June 19, 2011. The liver is
heterogeneous with there being observed multiple hypoechoic areas in the
right lobe of the liver. The findings are consistent with diffuse hepatic
metastatic disease. Confirmation by repeat hepatic CT or hepatic MR is
recommended. The head and body of the pancreas show no specific
abnormalities. The pancreatic tail is obscured. No pseudocyst formation or
fluid about the pancreas is seen. There is noted a small amount of ascites
about the liver. The spleen is mildly enlarged. The spleen measures 13.42 cm
at maximum diameter. On the prior exam of 06/19/2011, the spleen measures
cm in AP diameter. The abdominal aorta is not optimally visualized in this
exam but the upper portion is visualized with no significant abnormality
seen. The inferior vena cava is normal in appearance. The gallbladder is not
seen compatible with prior cholecystectomy. The common bile duct measures
3.9 mm in number which is within normal limits. The kidneys show no
hydronephrosis. Sagittally, the right kidney measures 10.42 cm and the left
measures 11.36 cm.
IMPRESSION: 1. The liver is enlarged, nonhomogeneous and shows progressive nodularity
consistent with diffuse hepatic metastatic disease.
2. There is mild enlargement of the spleen.
3. The patient has had prior cholecystectomy.
4. There is a small amount of ascites noted inferior to the liver.

## 2014-10-06 NOTE — Consult Note (Signed)
Chief Complaint:   Subjective/Chief Complaint Feels better today and tolerating full liquid diet.   VITAL SIGNS/ANCILLARY NOTES: **Vital Signs.:   17-May-13 13:37   Vital Signs Type Routine   Temperature Temperature (F) 97.9   Celsius 36.6   Temperature Source oral   Pulse Pulse 81   Pulse source per Dinamap   Respirations Respirations 18   Systolic BP Systolic BP 813   Diastolic BP (mmHg) Diastolic BP (mmHg) 66   Mean BP 89   BP Source Dinamap   Pulse Ox % Pulse Ox % 96   Pulse Ox Activity Level  At rest   Oxygen Delivery Room Air/ 21 %   Brief Assessment:   Additional Physical Exam Abdomen distended but soft and benign. Hepatomegaly was again noted.   Routine Chem:  17-May-13 03:46    Glucose, Serum 89   BUN 9   Creatinine (comp) 0.74   Sodium, Serum 121   Potassium, Serum 5.4   Chloride, Serum 85   CO2, Serum 18   Calcium (Total), Serum 8.5   Osmolality (calc) 242   eGFR (African American) >60   eGFR (Non-African American) >60   Anion Gap 18   Lipase 786   Magnesium, Serum 1.6  Blood Glucose:  17-May-13 07:27    POCT Blood Glucose 63    10:59    POCT Blood Glucose 81    16:40    POCT Blood Glucose 67   Assessment/Plan:  Assessment/Plan:   Assessment Pancreatitis. Improving. Lipase is better. Hepatic malignancy. Records from [facility] reviewed. 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 The prognosis is poor and she should consider hospice.    Plan Agree with full liquid diet. Will follow.   Electronic Signatures: Jill Side (MD)  (Signed 17-May-13 19:47)  Authored: Chief Complaint, VITAL SIGNS/ANCILLARY NOTES, Brief Assessment, Lab Results, Assessment/Plan   Last Updated: 17-May-13 19:47 by Jill Side (MD)

## 2014-10-06 NOTE — Consult Note (Signed)
History of Present Illness:   Reason for Consult Increased IgM.    HPI Patient is a 69 year old female recently admitted with pneumonia and dehydration.  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  Currently patient feels weak and fatigued, but improved since her admission.  She has no neurologic complaints.  She continues to have poor by mouth intake, but states this is improving as well.  She did not complain of nausea today.  She denies any bony pain  She has no constipation or diarrhea.  She has no urinary complaints.  Patient offers no further specific complaints today.   PFSH:   Additional Past Medical and Surgical History Past medical history: Hypertension, neuropathy, GERD, hyperlipidemia, diabetes, CAD.  Past surgical history: Hysterectomy, hernia repair, cholecystectomy.  Social history: Patient denies tobacco or alcohol.  Family history: CAD.   Review of Systems:   Performance Status (ECOG) 1    Review of Systems As per HPI. Otherwise, 10 point system review was negative.   NURSING NOTES: **Vital Signs.:   19-Jan-13 08:07    Vital Signs Type: Routine    Temperature Temperature (F): 97.8    Celsius: 36.5    Temperature Source: axillary    Pulse Pulse: 110    Pulse source: per Dinamap    Respirations Respirations: 18    Systolic BP Systolic BP: 814    Diastolic BP (mmHg) Diastolic BP (mmHg): 90    Mean BP: 107    BP Source: Dinamap    Pulse Ox % Pulse Ox %: 96    Pulse Ox Activity Level: At rest    Oxygen Delivery: Room Air/ 21 %   Physical Exam:   Physical Exam General: Well-developed, well-nourished, no acute distress. Eyes: Pink conjunctiva, anicteric sclera. HEENT: Normocephalic, moist mucous membranes, clear oropharnyx. Lungs: Clear to auscultation bilaterally. Heart: Regular rate and rhythm. No rubs, murmurs, or gallops. Abdomen: Soft, nontender, nondistended. No organomegaly noted, normoactive  bowel sounds. Musculoskeletal: No edema, cyanosis, or clubbing. Neuro: Alert, answering all questions appropriately. Cranial nerves grossly intact. Skin: No rashes or petechiae noted. Psych: Normal affect. Lymphatics: No cervical, calvicular, axillary or inguinal LAD.    Adenosine: Hives, Wheezing, Cough, Rash  Codeine: Unknown  Sulfa drugs: Unknown  PCN: Unknown  Latex: Unknown    Premarin: 0625 MG/GM crea (estrogens, conjugated vaginal) 1 gm PV nightly at bedtime 3 times per week., Active, 0, None   Norco: 7.5 - 500 mg tabs (hydrocodone - acetaminophen) 1 by mouth every 6 hrs as needed for severe pain., Active, 0, None   Singulair 10 mg oral tablet: 1 tab(s) orally once a day (at bedtime), Active, 0, None   Patanol 0.1% ophthalmic solution: 1 drop(s) to each affected eye 2 times a day, Active, 0, None   triamcinolone acetonide: 0.1 % crea. Aplly to affected area twice daily for up to 2 weeks as needed., Active, 0, None   omeprazole 20 mg oral delayed release capsule: 1 by mouth twice daily for reflux., Active, 0, None   Neurontin 300 mg oral capsule: cap(s) orally 1 in am and 1 at lunch and 2 at bedtime for nerve pain., Active, 0, None   simvastatin 80 mg oral tablet: 1 daily each evening for high cholesterol., Active, 0, None   Cozaar 100 mg oral tablet: 1 tab(s) orally once a day, Active, 0, None   furosemide 40 mg oral tablet: 1 tab(s) orally once a day every am for fluid  and high BP., Active, 0, None   Procardia XL 60 mg oral tablet, extended release: 1 tab(s) orally once a day, Active, 0, None   aspirin: enteric coated 325 mg tab 1 by mouth daily, Active, 0, None   Calcium 600+D: + D 600-400 mg unit tabs 1 by mouth twice daily for bone health., Active, 0, None   multivitamin: 1 tab(s) orally once a day, Active, 0, None   potassium chloride 10 mEq oral tablet, extended release: 1 tab(s) orally 2 times a day, Active, 0, None   Toprol-XL 50 mg oral tablet, extended  release: 1 tab(s) orally once a day, Active, 0, None   predniSONE: Taper as directed, Active, 0, None   Glucophage 500 mg oral tablet: 1 tab(s) orally 2 times a day-Restart on 06/21/11 in the evening, Active, 0, None   Zofran 4 mg oral tablet: tab(s) orally every 6 hours, As Needed- for Nausea, Vomiting , Active, 0, None  Routine Chem:  18-Jan-13 05:06    Glucose, Serum 66   BUN 16   Creatinine (comp) 0.99   Sodium, Serum 137   Potassium, Serum 4.5   Chloride, Serum 99   CO2, Serum 28   Calcium (Total), Serum 9.3   Osmolality (calc) 273   eGFR (African American) >60   eGFR (Non-African American) >60   Anion Gap 10   Assessment and Plan:   Impression Positive SIEP.    Plan 1.  Positive SIEP/elevated IgM: Patient does not have an M spike.  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  There is no evidence of underlying multiple myeloma and she is at minimal risk for progressing.    Appreciate consult, call with questions.   Electronic Signatures: Delight Hoh (MD)  (Signed 19-Jan-13 11:56)  Authored: HISTORY OF PRESENT ILLNESS, PFSH, ROS, NURSING NOTES, PE, ALLERGIES, HOME MEDICATIONS, LABS, ASSESSMENT AND PLAN   Last Updated: 19-Jan-13 11:56 by Delight Hoh (MD)

## 2014-10-06 NOTE — Consult Note (Signed)
Chief Complaint:   Subjective/Chief Complaint Overall feels better but could not tolerate solid food well. No significant abdominal pain. Lipase is still elavated but better. Pedal edema improved.  Recommendations: Liquid diet. for another 24-48 hours before trying advancing it again. Will follow.   Electronic Signatures: Lurline DelIftikhar, Deyja Sochacki (MD)  (Signed [phone]-May-13 17:05)  Authored: Chief Complaint   Last Updated: 16-May-13 17:05 by Lurline DelIftikhar, Di Jasmer (MD)

## 2014-10-06 NOTE — H&P (Signed)
PATIENT NAME:  Sandra Vaughn, Sandra Vaughn MR#:  [id number] DATE OF BIRTH:  10-Apr-1946  DATE OF ADMISSION:  06/16/2011  PRIMARY CARE PHYSICIAN: Hillery AldoSarah Amiylah Anastos, MD  EMERGENCY ROOM PHYSICIAN: Maurilio LovelyNoelle McLaurin, MD  CHIEF COMPLAINT: Nausea, vomiting, and abdominal pain.   HISTORY OF PRESENT ILLNESS: The patient is a 69 year old African American female who presents with chief complaint of abdominal pain. She describes it as dull pain. Symptoms began two months ago. The patient has been having abdominal pain off and on associated with nausea. Symptoms have been gradually getting worse. The patient was recently diagnosed with upper respiratory tract infection and was treated with antibiotics. Her nausea has not improved. She continues to have abdominal pain. She had an episode of vomiting. The patient is unable to keep anything down. She has a poor appetite.  PAST MEDICAL HISTORY:  1. Hypertension.  2. Neuropathy. 3. Gastroesophageal reflux disease. 4. Hyperlipidemia.  5. Diabetes, type 2.  6. Myocardial infarction.  7. Coronary artery disease with stent placement.  8. Back surgery. 9. Partial hysterectomy. 10. Hernia repair. 11. Cesarean section. 12. Cholecystectomy.  SOCIAL HISTORY: The patient lives alone. She denies tobacco abuse, alcohol abuse, or drug abuse.   FAMILY HISTORY: The patient's one brother had a stroke. Father died and had a myocardial infarction. Her mother died in her late 5180s and had a myocardial infarction.   REVIEW OF SYSTEMS: CONSTITUTIONAL: The patient denies any fevers, chills, or night sweats. HEENT: The patient denies any hearing loss, dysphagia, visual problems, or sore throat. CARDIOVASCULAR: The patient denies any chest pain, orthopnea, or PND. RESPIRATORY: The patient denies any cough, wheezing, or hemoptysis. GI: The patient denies any nausea, vomiting, abdominal pain, hematemesis, hematochezia, or melena. GU: The patient denies any hematuria, dysuria, or frequency. NEUROLOGIC:  The patient denies any headache, focal weakness, or seizures. SKIN: The patient denies lesions or rash. ENDOCRINE: The patient denies polyuria, polyphagia, or polydipsia. MUSCULOSKELETAL: The patient denies any arthritis, joint effusion, or swelling. HEMATOLOGIC: The patient denies any easy bleeding or bruises.   PHYSICAL EXAMINATION:   VITAL SIGNS: Temperature 95.4, heart rate 75, respiratory rate 20, blood pressure 116/58, and oxygen saturation 97%.   HEENT: Atraumatic, normocephalic. Pupils are equal, round, and reactive to light. Extraocular movements intact. Sclerae anicteric. Mucous membranes dry.   NECK: Supple. No organomegaly.   HEART:  S1 and S2 regular rate and rhythm. No gallops. No thrills. No murmurs.   LUNGS: Clear to auscultation. No rales, no rhonchi, no wheezes, and no bronchial breath sounds.   GI: Abdomen is soft, nontender, and nondistended. Normal bowel sounds. No hepatosplenomegaly.   GU: No hematuria or masses are noted.   SKIN: No lesions. No rash.   ENDOCRINE: No masses. No thyromegaly.   LYMPH: No lymphadenopathy or nodes palpable.  NEURO: Cranial nerves II through XII grossly intact.  Motor strength is 5/5 in bilateral upper and lower extremities. Sensation is within normal limits. No focal neurological deficits are noted on examination.   MUSCULOSKELETAL: No arthritis, joint effusion, or swelling.   HEMATOLOGIC: No ecchymosis, no bleeding, and no petechiae is noted.   EXTREMITIES: No cyanosis, no clubbing, and no edema. 2+ pedal pulses are noted bilaterally.   LABS/STUDIES: Troponin is less than 0.02. Total CK 52. CK-MB 0.9. PT 13.4. INR 1.0. PTT 35.3. WBC count 9100, hemoglobin 14, hematocrit 41.4, platelet count 262, and MCV 95. Glucose 72, BUN 26, creatinine 1.4, sodium 130, potassium 5.1, chloride 91, CO2 27, calcium 9.8, total bilirubin 0.4, alkaline phosphatase 89,  ALT 34, AST 85, total protein 8.5, and albumin 4.3. Estimated GFR 49.   Chest x-ray  is negative.   CT of the abdomen and pelvis without contrast is pending at this time.   ASSESSMENT AND PLAN:  1. The patient is a 69 year old African American female who presents with nausea, vomiting, abdominal pain, and acute pancreatitis. We will admit to the medical floor. Keep the patient n.p.o. Start IV fluids. Monitor the patient's lipase level. Start morphine for pain control.  2. Hypertension: Continue Cozaar and metoprolol. 3. Gastroesophageal reflux disease: Continue omeprazole.  4. Neuropathy: Continue Neurontin. 5. Coronary artery disease: We will continue the patient on aspirin.  6. Diabetes mellitus, type 2: Accu-Cheks. We will check a Hemoglobin A1c. ____________________________ Donia Ast, MD jsp:[id number] D: 06/17/2011 00:08:18 ET    T: 06/17/2011 09:00:31 ET       JOB#: [id number] cc: Maralyn Sago "Maryruth Hancock, MD Donia Ast MD ELECTRONICALLY SIGNED 06/18/2011 12:12

## 2014-10-06 NOTE — Consult Note (Signed)
PATIENT NAME:  Sandra Vaughn, Sandra Vaughn MR#:  [id number] DATE OF BIRTH:  1946-05-22  DATE OF CONSULTATION:  06/18/2011  REFERRING PHYSICIAN:   Alford Highlandichard Wieting, MD CONSULTING PHYSICIAN:  Lurline DelShaukat Mykhia Danish, MD  REASON FOR CONSULTATION: Abdominal pain.   HISTORY OF PRESENT ILLNESS: This is a 69 year old African American female who is complaining of right upper abdominal flank and back pain for almost two months. The pain is very positional and certain positions cause increased abdominal and back pain. The pain is intermittent and sometimes feels sharp and stabbing and lasts only for a few seconds. In addition to this sharp, shooting, positional abdominal pain. She does appear to have some degree of constant aching abdominal discomfort associated with nausea.  She has vomited once in the last few days. Her appetite is not great and she overall does not feel very well.  On arrival the patient's lipase was 760, although the CT scan did not show significant pancreatic or peripancreatic inflammation. Her lipase has become normal quite rapidly while she continues to have significant abdominal discomfort. The patient had a noncontrast CT scan on admission, which showed some questionable hypodense lesions within the liver and an irregular liver contour suggesting cirrhosis. The patient has prior cholecystectomy. No other significant abnormalities were noted. The patient apparently had an upper GI endoscopy last year which showed just a hiatal hernia. The patient denies any significant change in her bowel habits, melena, rectal bleeding, etc.   PAST MEDICAL HISTORY:  1. Hypertension.  2. History of neuropathy. 3. History of lower back surgery secondary to lumbar disk disease.  4. Gastroesophageal reflux disease.  5. Hyperlipidemia.  6. Diabetes.  7. Myocardial infarction.  8. Coronary artery disease status post stent placement.  9. Partial hysterectomy.  10. Hernia repair. 11. Cholecystectomy.   SOCIAL HISTORY: She  lives alone. Does not smoke or drink.   FAMILY HISTORY: Unremarkable.   REVIEW OF SYSTEMS: Negative except for what is mentioned in the History of Present Illness.   PHYSICAL EXAMINATION:  GENERAL: Obese female. She does not appear to be in any acute distress, quite awake and alert.   VITAL SIGNS: Temperature 97.4, pulse 86, respirations 20, blood pressure 144/80. Clinically she does not appear to be jaundiced or anemic.   NECK: Neck veins are flat.   LUNGS: Grossly clear to auscultation bilaterally.   CARDIOVASCULAR: Regular rate and rhythm. No gallops or murmur.   ABDOMEN: Examination showed some tenderness in the epigastric and right upper quadrant area. There is some tenderness over the right lower ribs radiating into the flank area. No rebound or guarding was noted. No hepatosplenomegaly. Bowel sounds are positive.   EXTREMITIES: No edema.   NEUROLOGIC: Examination appears to be unremarkable.   SKIN: Skin is grossly unremarkable as well.   LABORATORY, DIAGNOSTIC, AND RADIOLOGICAL DATA: Hemoglobin 12.1, hematocrit 35.7, platelet count 208, white cell count normal at 6.5. PT and INR are within normal limits. Electrolytes unremarkable. Serum lipase is now normal. AST is mildly elevated at 85. The rest of the liver enzymes are normal. CT scan without contrast as above. MRI  of the thoracic spine showed degenerative disk disease, some lumbar disk disease was noted as well.   ASSESSMENT AND PLAN: The patient is with upper abdominal pain that is sharp, shooting, and is positional, likely to be musculoskeletal in origin.  The patient is also complaining of dyspeptic symptoms with nausea and some vomiting with poor appetite, raising concerns about possible GI pathology. Apparently an upper GI endoscopy last  year was unremarkable except for a hiatal hernia. The CT scan of the abdomen showed somewhat abnormal liver with some hypodense areas within the liver.  I have recommended an ultrasound and  if the ultrasound is inconclusive I would recommend a CT scan of the abdomen with contrast for further evaluation of hepatic abnormalities. The patient does have some hepatomegaly, although I do not believe that is the cause of her abdominal pain. The patient's hepatitis profile is pending. Agree with the rest of the present management. We will follow and make further recommendations.     ____________________________ Lurline Del, MD si:[id number] D: 06/18/2011 20:44:24 ET T: 06/19/2011 11:38:15 ET JOB#: [id number]  cc: Lurline Del, MD, <Dictator> Lurline Del MD ELECTRONICALLY SIGNED 06/19/2011 14:37

## 2014-10-06 NOTE — Consult Note (Signed)
Chief Complaint:   Subjective/Chief Complaint Overall better. Feels hungry. Abdominal examination unchanged. Records from [facility] pending.  Will continue with clear liquids. Probable hospice care. Will review records from [facility].   VITAL SIGNS/ANCILLARY NOTES: **Vital Signs.:   14-May-13 16:08   Vital Signs Type Routine   Temperature Temperature (F) 97.9   Celsius 36.6   Temperature Source oral   Pulse Pulse 75   Pulse source per Dinamap   Respirations Respirations 18   Systolic BP Systolic BP 711   Diastolic BP (mmHg) Diastolic BP (mmHg) 63   Mean BP 79   BP Source Dinamap   Pulse Ox % Pulse Ox % 97   Pulse Ox Activity Level  At rest   Oxygen Delivery Room Air/ 21 %   Routine Chem:  14-May-13 04:22    Glucose, Serum 57   BUN 16   Creatinine (comp) 0.82   Sodium, Serum 121   Potassium, Serum 4.7   Chloride, Serum 84   CO2, Serum 21   Calcium (Total), Serum 8.5  Hepatic:  14-May-13 04:22    Bilirubin, Total 0.7   Alkaline Phosphatase 154   SGPT (ALT) 27   SGOT (AST) 124   Total Protein, Serum 6.1   Albumin, Serum 3.1  Routine Chem:  14-May-13 04:22    Osmolality (calc) 243   eGFR (African American) >60   eGFR (Non-African American) >60   Anion Gap 16  Routine Hem:  14-May-13 04:22    WBC (CBC) 4.4   RBC (CBC) 3.77   Hemoglobin (CBC) 12.6   Hematocrit (CBC) 37.7   Platelet Count (CBC) 187   MCV 100   MCH 33.5   MCHC 33.4   RDW 15.2  Routine Chem:  14-May-13 04:22    Lipase 2260  Routine Hem:  14-May-13 04:22    Neutrophil % 59.5   Lymphocyte % 23.3   Monocyte % 13.5   Eosinophil % 3.0   Basophil % 0.7   Neutrophil # 2.6   Lymphocyte # 1.0   Monocyte # 0.6   Eosinophil # 0.1   Basophil # 0.0  Routine Chem:  14-May-13 04:22    Magnesium, Serum 0.9   Hemoglobin A1c ([facility]) 4.5   Cholesterol, Serum 117   Triglycerides, Serum 172   HDL (INHOUSE) 53   VLDL Cholesterol Calculated 34   LDL Cholesterol Calculated 30  Blood Glucose:  14-May-13  08:00    POCT Blood Glucose 65    08:38    POCT Blood Glucose 96    12:07    POCT Blood Glucose 81    16:08    POCT Blood Glucose 72    16:47    POCT Blood Glucose 90   Electronic Signatures: Jill Side (MD)  (Signed 14-May-13 19:24)  Authored: Chief Complaint, VITAL SIGNS/ANCILLARY NOTES, Lab Results   Last Updated: 14-May-13 19:24 by Jill Side (MD)

## 2014-10-06 NOTE — Discharge Summary (Signed)
PATIENT NAME:  Sandra Vaughn, Keaundra M MR#:  [id number] DATE OF BIRTH:  1945/07/27  DATE OF ADMISSION:  07/01/2011 DATE OF DISCHARGE:  07/05/2011  DISCHARGE DIAGNOSES:   1. Chest pain, myocardial infarction and pulmonary embolus ruled out, suspect pneumonia. 2. Diabetes. 3. Hypertension. 4. Hyperlipidemia. 5. Coronary artery disease. 6. Gastroesophageal reflux disease.   7. Neuropathy. 8. Acute renal failure, resolved.  9. Hyperlipidemia.  10. Recent admission with abdominal pain with abnormal appearance of the liver.   HOSPITAL COURSE: This is a 69 year old female who has history of coronary artery disease, diabetes, hypertension, hyperlipidemia, and neuropathy. She was recently discharged from the hospital on 06/20/2011. She had a GI work-up at that time. She had multiple CT scans and ultrasound of the abdomen which showed that she had an abnormal appearance of the left lobe of liver, possible cirrhosis, and she was supposed to follow-up with a GI doctor. She presented with some chest and abdominal pain this time. At the time of admission, they found out that her D-dimer was elevated in the range of 4.39 so V/Q scan was done because of chest pain and the V/Q scan showed low probability. Also, at the time of admission she had mild acute renal failure with a creatinine of 1.41 and a BUN of 19. She was on Lasix. That was held and she was given some IV hydration. Her creatinine resolved with that. Her creatinine at discharge was around 0.91. I am going to hold the Lasix right now. She was also complaining of some cough with the chest pain. She was empirically started on antibiotics also and her chest pain has improved with antibiotics. Her chest x-ray showed that she had some right basal linear opacities likely secondary to atelectasis. She also had a CT of the chest done because of ongoing chest pain and the CT of the chest did not show any PE, large airways patent, basilar, and it also showed a questionable  low density lesion in the pancreas and questionable lucencies in the liver. The patient will be needing a MRI of the abdomen as outpatient. The patient already had two CT scans and ultrasound of the abdomen this month. I discussed this with Dr. Niel HummerIftikhar,, who is supposed to follow her and he is going to order the MRI of the abdomen as outpatient. Her white count remained stable in the range of 7.9 to 10.8. Hemoglobin remained stable in the range of 11.8 to 12. Her urinalysis has been negative. She ruled out for MI. She said that she had a stress test last year by a cardiologist in [place] and that was negative as per the patient. She also had an Oncology consult done here because of IgM spike on her previous SPEP during her last admission but that was polyclonal and no further Oncology work-up was needed as per Dr. Orlie DakinFinnegan. The patient was complaining of some dizziness yesterday on January 20th. We did orthostatics that were negative and her hemoglobin and creatinine have been stable. Her dizziness has improved right now. We are going to ambulate her before discharge. Her metformin was actually discontinued here because she had a contrast study. This can be restarted after discharge today.   MEDICATIONS AT DISCHARGE:  1. Premarin 0.625 mg nightly at bedtime 3 times a week. 2. Norco 7.5 to 500 q.6 hours as needed. 3. Singulair 10 mg at bedtime. 4. Patanol ophthalmic solution one drop to each affected eye twice a day.  5. Triamcinolone apply to affected area twice daily for  up to two weeks.  6. Omeprazole 20 mg twice daily. 7. Neurontin 300 mg in the a.m., one at lunchtime, and two at bedtime. 8. Cozaar 100 mg daily. 9. Procardia XL 60 mg daily. 10. Calcium with Vitamin D twice a day. 11. Toprol-XL 50 mg daily. 12. Zofran q.6 hours as needed.   NEW MEDICATIONS:  1. Levaquin 750 mg p.o. once daily for four days. 2. Tessalon Perles 100 mg p.o. q.6 hours as needed for cough. 3. Restart Glucophage after  discharge today. 4. Change simvastatin to 40 mg p.o. at bedtime. Her simvastatin has been decreased because her AST has been a little bit elevated. When she came in, her AST was 92, normal ALT of 31. Her AST has improved to 79.   DO NOT TAKE:  1. Lasix. 2. Potassium.  DIET: Advised a low sodium, ADA, low cholesterol diet.   ACTIVITY: As tolerated.   CONDITION AT DISCHARGE: She is comfortable. Her chest pain and significant cough has improved. T-max 98, heart rate 82, blood pressure 123/72, saturating 95% on room air. Chest is clear. Heart sounds regular. Abdomen soft, nontender. She has mild epigastric pain which is chronic.   FOLLOW-UP:  1. Follow-up with Dr. Maryruth Hancock in one week. Follow-up BMP and LFTs in Dr. Eliane Decree office.  2. Please keep your appointment with GI, Dr. Niel Hummer.  3. Please follow with your cardiologist in [place].  4. The patient needs an MRI of the abdomen as outpatient.   TIME SPENT WITH DISCHARGE: 50 minutes.   ____________________________ Fredia Sorrow, MD ag:drc D: 07/05/2011 10:38:10 ET T: 07/05/2011 11:36:51 ET JOB#: [id number]  cc: Fredia Sorrow, MD, <Dictator> Sarah "Sallie" Allena Katz, MD Lurline Del, MD Fredia Sorrow MD ELECTRONICALLY SIGNED 08/05/2011 13:36

## 2014-10-06 NOTE — H&P (Signed)
PATIENT NAME:  Sandra Vaughn, Sandra Vaughn MR#:  [id number] DATE OF BIRTH:  1946-04-27  DATE OF ADMISSION:  11/12/2011  PRIMARY MD: Maryruth Hancock, MD ED REFERRING DOCTOR: Dr. Mindi Junker   CHIEF COMPLAINT: Sent here for hyponatremia, cellulitis.   HISTORY OF PRESENT ILLNESS: The patient is a pleasant 69 year old African American female with hypertension, gastroesophageal reflux disease, hyperlipidemia, diet controlled diabetes, coronary artery disease, peripheral vascular disease, recent diagnosis of liver cancer in February 2013 who is currently being followed by Hospice, is DNR who went for a followup at [facility] for her liver cancer and had blood work done. She was noted to have a sodium level of 119. To note, when she was discharged from here it was 124. They wanted her to be admitted to the hospital there. However, since the patient's all her family members are here, she stated that she wanted to come to [facility]. Therefore, she was brought here.   The patient here had repeat blood work done, and her sodium level is actually 123, which is close to her discharge sodium level. Patient also reports that since discharge has noticed swelling in both of her lower extremities with erythema and warmth. She was seen by her primary care MD and was started on oral antibiotics. Se has not sure which one. She has been taking the oral antibiotics without much improvement in the erythema. She otherwise denies any fevers or chills. Reports that she has been feeling very tired and fatigued. Has not been eating as well. Has not had any chest pains or shortness of breath. Does complain of some lower abdominal pain, cramping type symptoms. No nausea, vomiting, or diarrhea. She does complain that she has been urinating a lot more with the Aldactone, which has been recently started. She also reports that her abdomen has been chronically distended. It is unchanged.   PAST MEDICAL HISTORY:  1. History of liver cancer,  currently followed at [facility].   2. Hypertension.  3. History of neuropathy.  4. Gastroesophageal reflux disease.  5. Hyperlipidemia.  6. Recent diagnosis of pancreatitis of unclear etiology.  7. Chronic hyponatremia.  8. Anasarca.  9. Diabetes, which is diet controlled.  10. History of peripheral vascular disease.  11. Carotid stenosis.  12. Hyperlipidemia.  13. Gastroesophageal reflux disease.  14. Coronary artery disease.   PAST SURGICAL HISTORY:  1. Status post liver biopsies.  2. Status post left renal artery stent.  3. Status post hysterectomy.  4. Status post hernia repair.  5. Status post cholecystectomy.  6. Status post cesarean section.   ALLERGIES: Adenosine and codeine, penicillin, sulfa, latex.   CURRENT MEDICATIONS: The patient reports that she is on the same medications as a recent discharge. She has been started on torsemide 10 one tab p.o. b.i.d., spironolactone 50 daily, Singulair 10 p.o. at bedtime, Patanol 0.1% ophthalmic solution 1 drop to each eye b.i.d., triamcinolone acetonide 0.1% cream apply to affected area b.i.d., omeprazole 20 p.o. b.i.d., Neurontin 300 p.o. in the morning, 300 at lunchtime, 600 at bedtime. Aspirin 325 mg p.o. daily, calcium with vitamin D 600/400 one tab p.o. b.i.d., multivitamin p.o. daily, Zofran 4 mg q.6 p.r.n., Tessalon Perles 100 mg q.6 p.r.n., simvastatin 40 mg at bedtime, nitroglycerin 0.4 sublingual p.r.n. chest pain, iron sulfate 325 mg p.o. daily, acetaminophen/oxycodone 10 mg q.4 p.r.n., Mag-Oxide 400 mg p.o. b.i.d., Toprol-XL 100 daily.   SOCIAL HISTORY: Currently lives in [place]. She quit smoking in 1986. No alcohol or drug use.   FAMILY HISTORY: Mother  died of a heart attack. Brother died of a heart attack. Father had liver cirrhosis and died of congestive heart failure.   REVIEW OF SYSTEMS: CONSTITUTIONAL: Denies any fevers. Complains of fatigue, weakness. Has some abdominal pain. No weight loss. No weight gain. EYES: No  blurred vision. No pain. No redness. ENT: No tinnitus. No ear pain. No hearing loss. No seasonal or year-round allergies. No difficulty swallowing. RESPIRATORY: No cough. No wheezing. No hemoptysis. No chronic obstructive pulmonary disease. No tuberculosis. CARDIOVASCULAR: No chest pain. No orthopnea. Has chronic edema of the lower extremity. GASTROINTESTINAL: No nausea or vomiting. Does complain of lower abdominal pain. No hematemesis. No rectal bleeding. GU: Denies any dysuria, hematuria, renal calculus, or frequency. ENDOCRINE: Denies any polyuria, nocturia, or thyroid problems. HEME/LYMPH: Denies anemia, easy bruisability, or bleeding. SKIN: No acne. No rash. No changes in mole, hair or skin. She has bilateral lower extremity erythema. MUSCULOSKELETAL: Denies any pain in neck, back, or shoulder. NEUROLOGIC: No numbness. No cerebrovascular accident. No transient ischemic attack. PSYCHIATRIC: No anxiety. No insomnia. No ADD. No OCD.   PHYSICAL EXAMINATION:  VITAL SIGNS: Temperature 97, pulse 80, respirations 18, blood pressure 154/66, O2 of 98%.   GENERAL: The patient is an ill-appearing, 69 year old African American female currently not in any acute distress.   HEENT: Head atraumatic, normocephalic. Pupils equally round, reactive to light and accommodation. Extraocular movements intact. There is no conjunctival pallor. No scleral icterus. Oropharynx is clear without any exudates, is a little dry.   NECK: No thyromegaly. No carotid bruits.   CARDIOVASCULAR: Regular rate and rhythm. No murmurs, rubs, clicks, or gallops. PMI is not displaced.   LUNGS: Clear to auscultation bilaterally without any rales, rhonchi, or wheezing. There is no accessory muscle usage.   ABDOMEN: Distended. Positive bowel sounds. No hepatosplenomegaly appreciated.   EXTREMITIES: She has 2+ edema as well as erythema involving up to below her knee. There is also warmth.    SKIN: Erythema involving the lower extremities. No  other rashes noted.   MUSCULOSKELETAL: No swelling in the joints.   PSYCHIATRIC: Not anxious or depressed.   NEUROLOGIC: The patient is awake, alert, oriented x3. Cranial nerves II through XII grossly intact. No focal deficits.   EVALUATIONS: In the ED the patient's glucose is 103, BUN 26, creatinine 1.06, sodium 123, potassium 4.2, chloride 83, CO2 was 22. Anion gap of 18, calcium 10. LFTs showed alkaline phosphatase of 157, AST 148. Rest of the LFTs are normal. CPK 73, CK-MB 2.4. Troponin less than 0.02. WBC 7.1, hemoglobin 11.6, platelet count 228,000. INR of 1.   ASSESSMENT AND PLAN: The patient is a 69 year old African American female with liver cancer, anasarca, hypertension, diabetes, hyponatremia, was seen at [facility], had blood work, showed sodium to be 119, requested admission there but patient wanted to come here. She is noted to have sodium here close to her baseline, also now has bilateral lower extremity cellulitis.  1. Bilateral lower extremity cellulitis. At this time I will go ahead and treat her with IV vancomycin and Ancef. Although she had a recent Doppler that was negative for deep venous thrombosis, she is at high risk for developing deep vein thrombosis. I will go ahead and order bilateral lower extremity Doppler.  2. Hypernatremia which is chronic in nature. Close to her baseline, on discharge was 124, currently is 123. There is not much change. At this point I will hold her Spironolactone and Lasix today, follow BMP. Based on what her sodium is today further changes  need to be made.  3. Liver cancer, prognosis very poor. [facility]. She needs to continue to do that.  4. Hypertension which has been labile. We will continue Toprol-XL. We will use p.r.n. hydralazine.  5. Peripheral vascular disease. Continue aspirin.  6. Coronary artery disease. Continue aspirin and Toprol-XL.   CODE STATUS: DO NOT RESUSCITATE. Has been followed by Hospice, which she will need to  continue on discharge.   TIME SPENT: 35 minutes spent.  ____________________________ Lacie ScottsShreyang H. Allena KatzPatel, MD shp:vtd D: 11/12/2011 21:19:13 ET   T: 11/13/2011 08:53:26 ET    JOB#: [id number] cc: Suda Forbess H. Allena KatzPatel, MD, <Dictator>    Sarah "Sallie" Allena KatzPatel, MD Charise CarwinSHREYANG H Tredarius Cobern MD ELECTRONICALLY SIGNED 11/22/2011 8:04

## 2014-10-06 NOTE — Discharge Summary (Signed)
PATIENT NAME:  Sandra Vaughn, Sandra Vaughn MR#:  [id number] DATE OF BIRTH:  Apr 14, 1946  DATE OF ADMISSION:  07/01/2011 DATE OF DISCHARGE:  07/05/2011  ADDENDUM:     We ambulated her at discharge.  She ambulated well.  Her dizziness has improved. Her blood pressure was running on the lower side after ambulation in the range of 108/75 to 100/68.  I have cut down her blood pressure medications at this time. I have stopped her Procardia and cut down her Cozaar to 50 mg daily. I have continued her metoprolol XL. I advised her to follow up with Dr. Maryruth HancockSallie Patel in 1 to 2 days to follow up her blood pressure. She was on Lasix. She was on Procardia. She was on Cozaar. She was on metoprolol. Now I have stopped the Lasix and Procardia altogether and I have cut down Cozaar and continued metoprolol because she has history of coronary artery disease. Labs were done this morning. They were stable. The patient tolerated her diet. Not orthostatic yesterday, not tachycardic. Blood pressure needs to be monitored carefully as an outpatient. I told her that if she feels dizzy or has any shortness of breath or chest pain, she should come to the Emergency Room or call her doctor.    ____________________________ Fredia SorrowAbhinav Thuy Atilano, MD ag:bjt D: 07/05/2011 16:49:52 ET T: 07/05/2011 17:11:32 ET JOB#: [id number]  cc: Fredia SorrowAbhinav Maryama Kuriakose, MD, <Dictator> Sarah "Sallie" Allena KatzPatel, MD Fredia SorrowABHINAV Cecillia Menees MD ELECTRONICALLY SIGNED 08/05/2011 13:39

## 2014-10-06 NOTE — Discharge Summary (Signed)
PATIENT NAME:  Sandra GuileOLIVER, Jelesa M MR#:  [id number] DATE OF BIRTH:  [birth year]  DATE OF ADMISSION:  06/16/2011 DATE OF DISCHARGE:  06/20/2011  PRIMARY CARE PHYSICIAN: [facility]   FINAL DIAGNOSES:  1. Abdominal pain and rib pain, possibly musculoskeletal.  2. Abnormal appearance of the liver with a hemangioma.  3. Hypertension.  4. Diabetes.  5. Hyperlipidemia.  6. Gastroesophageal reflux disease.  7. Peripheral vascular disease.   MEDICATIONS ON DISCHARGE:  1. Premarin cream 3 times a week vaginally.  2. Norco 7.5/500 1 tablet by mouth every six hours as needed for pain.  3. Singulair 10 mg at bedtime.  4. Patanol 0.1% ophthalmic solution one drop each eye twice a day. 5. Triamcinolone cream 0.1% apply to affected area twice a day for up to 2 weeks as needed.  6. Omeprazole 20 mg daily.  7. Neurontin 300 mg 1 capsule in the a.m., one at lunch, and two at bedtime. 8. Simvastatin 80 mg at bedtime.  9. Cozaar 100 mg daily.  10. Furosemide 40 mg daily.  11. Procardia XL 60 mg daily.  12. Aspirin 325 mg daily.  13. Calcium and Vitamin D twice a day.  14. Multivitamin 1 tablet daily.  15. Potassium chloride 10 mEq twice a day. 16. Toprol-XL 50 mg daily.  17. Prednisone taper as written on prescription.  18. Can restart Glucophage 500 mg twice a day on 06/21/2011 evening dose. 19. Zofran 4 mg every six hours as needed for nausea.   DIET: Low sodium, 1800 ADA diet.   ACTIVITY: Activity as tolerated.   FOLLOW-UP: 1. Follow-up with Dr. Niel HummerIftikhar, GI, in two weeks.  2. [facility].   REASON FOR ADMISSION: The patient was admitted 06/16/2011 and discharged 06/20/2011. She came in with nausea, vomiting, and abdominal pain.   HISTORY OF PRESENT ILLNESS: The patient is a 69 year old female who came in with abdominal pain, dull pain going on for about two months. She recently had an upper respiratory tract infection, unable to keep anything  down. She was admitted and given IV fluids, kept n.p.o., p.r.n. morphine.   LABORATORY AND RADIOLOGICAL DATA DURING THE HOSPITAL COURSE: Lipase 784. Glucose 72, BUN 26, creatinine 1.40, sodium 130, potassium 5.1, chloride 91, CO2 27, calcium 9.8. Liver function tests total protein 8.5, AST 85, ALT 34, alkaline phosphatase 89. White blood cell count 9.1, hemoglobin and hematocrit 14.0 and 41.1, platelet count 262. PT, INR, PTT normal range. Troponin negative. EKG normal sinus rhythm, anterior infarct. Chest x-ray no acute cardiopulmonary disease. CT scan of the abdomen and pelvis without contrast showed nonspecific low attenuating foci within the liver, hepatomegaly. Findings may represent cirrhotic changes within the liver. LDL 28, HDL 38, triglycerides 200. Hemoglobin A1c 6.1. Rib x-ray no rib fracture on the right. 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 Incidental note of L2-L3 and L3-L4 disk protrusion. Creatinine on the 5th 0.98. Ultrasound of the abdomen showed history of cholecystectomy, slightly prominent left lobe of the liver with heterogeneous texture. Margins are slightly are irregular. No discrete mass appreciated. CT scan of the abdomen and pelvis with contrast showed abnormal appearance of the left lobe of the liver on initial images. Differential includes hemangioma, underlying infiltrative process but that is less likely, hepatic steatosis. It looks, per the radiologist, like an underlying hemangioma. No CT evidence of pancreatitis. Significant atherosclerotic disease  common iliac, distal aortic, superior mesenteric, right renal, small periumbilical hernia. Lung bases are clear.   HOSPITAL COURSE PER PROBLEM LIST:  1. For the patient's abdominal pain and rib pain, the patient had a large work-up here. Initially it was thought to be a pancreatitis but the lipase was only elevated a small  amount. The patient did have imaging studies. I'm not sure if her pain is from the hepatomegaly or musculoskeletal. Rib x-ray was negative. MRI of the thoracic spine just showed degenerative changes. I do not think her pain is related to ischemic colitis. She is not giving a history of that. The patient was seen in consultation by Dr. Niel Hummer, Gastroenterology, and she will follow-up with him in about two weeks. Hepatitis profile was sent off but that is still pending at the time of discharge. A serum protein electrophoresis was sent off but that is still pending at the time of discharge. The patient was given a trial of Solu-Medrol and a prednisone taper to go home with just in case this pain is inflammatory. The patient does have pain when pressing over the ribs now on both sides. The patient was able to tolerate the diet and was discharged home in stable condition. She does have Vicodin at home for pain.  2. For the abnormal appearance of the liver, this is believed to be a hemangioma. Dr. Niel Hummer will review this with the radiologist to see if any further imaging test needs to be done or not. With the first CT scan showing possible cirrhosis, hepatitis profiles were sent off.  3. For the patient's hypertension, she is sent home on a smaller dose of Toprol-XL. Continued on her Procardia and Cozaar.  4. For her diabetes, her sugars may be elevated with the steroid taper. She can restart her Glucophage on 06/21/2011 in the evening. Hemoglobin A1c was actually very good.  5. For her hyperlipidemia, her cholesterol profile was excellent. I still recommend continuing the simvastatin 80 mg secondary to the peripheral vascular disease seen on the CT scan.  6. For her gastroesophageal reflux disease, she will continue omeprazole.  7. For her peripheral vascular disease, she is on aspirin and high dose simvastatin.   TIME SPENT ON DISCHARGE: 35 minutes.   ____________________________ Herschell Dimes. Renae Gloss,  MD rjw:drc D: 06/20/2011 15:29:53 ET T: 06/22/2011 10:47:08 ET JOB#: [id number]  cc: Herschell Dimes. Renae Gloss, MD, <Dictator> [facility] Lurline Del, MD Salley Scarlet MD ELECTRONICALLY SIGNED 07/02/2011 19:40

## 2014-10-06 NOTE — H&P (Signed)
PATIENT NAME:  Sandra Vaughn, Sandra Vaughn MR#:  [id number] DATE OF BIRTH:  March 10, 1946  DATE OF ADMISSION:  10/25/2011  REFERRING PHYSICIAN: Dr. Mindi Vaughn   PRIMARY PHYSICIAN: Sandra HancockSallie Patel, MD    PRESENTING COMPLAINT: Shortness of breath, chest pain, edema, weakness, anorexia.   HISTORY OF PRESENT ILLNESS: Sandra Vaughn is a pleasant 69 year old woman with history of hypertension, gastroesophageal reflux disease, hyperlipidemia, diet controlled diabetes, coronary artery disease, peripheral vascular disease, reported recent diagnosis of liver cancer in February 2013 who presents with reports of developing lower extremity swelling a week ago and has been progressively worse where it has been extending up her legs, abdomen, and arms and reports that she is not even able to bend her knee due to the swelling and difficulty with sitting due to unable to bend her knees. She reports chest pain that developed today in the left side and involving the mid sternal. She has had previous admissions and evaluations for chest pains. She endorses orthopnea, shortness of breath, and dyspnea on exertion. She reports anorexia and poor p.o. intake but no fevers, chills, nausea, or vomiting. Denies any choking spells. No diarrhea or constipation. No cough or hemoptysis.   PAST MEDICAL HISTORY:  1. Reports recent diagnosis of liver cancer February of 2013 via liver biopsy. The [facility].  2. Hypertension.  3. Neuropathy.  4. Gastroesophageal reflux disease.   5. Hyperlipidemia.  6. Diet-controlled diabetes, recently taken oral glycemics.  7. Coronary artery disease and MI.  8. Peripheral vascular disease/carotid artery stenosis.   PAST SURGICAL HISTORY:  1. Liver biopsy.  2. Left renal artery stent.  3. Hysterectomy.  4. Hernia repair.  5. Cholecystectomy.  6. Cesarean section.   ALLERGIES: Adenosine, codeine, penicillin, sulfa, latex.   MEDICATIONS:  1. Premarin cream 3 times a week.   2. Hydrocodone as needed.  3. Iron supplement daily.  4. Singulair 10 mg at bedtime.  5. Omeprazole 20 mg daily.  6. Neurontin 300 mg t.i.d. and at bedtime.  7. Simvastatin 40 mg at bedtime.  8. Cozaar 100 mg daily.  9. Lasix 40 mg daily.  10. Aspirin 325 mg daily.  11. Calcium Plus Vitamin D b.i.d.  12. Multivitamins daily.  13. Toprol-XL 200 mg daily.  14. Zofran 4 mg every six hours as needed.   SOCIAL HISTORY: She lives in [place] alone. She quit tobacco in 1986. No alcohol or drug use.   FAMILY HISTORY: Mother died of a heart attack. Brother died of a heart attack. Father had liver cirrhosis and died of CHF.   REVIEW OF SYSTEMS: CONSTITUTIONAL: No fevers or chills. Endorses poor p.o. intake and anorexia. EYES: No glaucoma or cataracts. ENT: No epistaxis, discharge, or dysphagia. RESPIRATORY: Denies cough. She endorses shortness of breath and dyspnea on exertion. CARDIOVASCULAR: As per history of present illness. She has dizziness but no syncopal spells or palpitations. GI: No nausea or vomiting. She reports abdominal pain in the upper abdomen. No hematemesis or melena. GU: No dysuria or hematuria. ENDOCRINE: No polyuria or polydipsia. HEME: No easy bleeding. SKIN: She reports rash on her abdomen and back. MUSCULOSKELETAL: She has knee pain due to the swelling around the knee. NEUROLOGIC: No one-sided weakness or numbness. PSYCH: Denies any suicidal ideation.    PHYSICAL EXAMINATION:   VITAL SIGNS: Temperature 97.2, pulse 81, respiratory rate 28, blood pressure 186/77, sating at 98% on room air.   GENERAL: Lying in bed in no apparent distress.   HEENT: Normocephalic, atraumatic. Pupils  equal, symmetric, nonicteric. Nares with nasal cannula in place. She has moist mucous membrane.   NECK: Soft and supple. No adenopathy. Unable to appreciate JVP.   CARDIOVASCULAR: Non-tachy. She has a systolic murmur 2+. No radiation.   LUNGS: Faint basilar crackles. No use of accessory  muscles or increased respiratory effort.   ABDOMEN: Edema and questionable ascites. She has positive bowel sounds. Unable to appreciate mass.   EXTREMITIES: 3+ pitting edema all the way to the thigh. Dorsal pedis pulses intact.   MUSCULOSKELETAL: She has effusion around the knee but does not appear to be joint effusion.   PSYCH: She is alert and oriented. The patient is cooperative.   NEUROLOGIC: No dysarthria or aphasia. Symmetrical strength.   PERTINENT LABS AND STUDIES: Lipase 2588. Glucose 103, BUN 19, creatinine 0.93, sodium 116, potassium 4.6, chloride 80, carbon dioxide 20, calcium 8.1, total bilirubin 0.6, alkaline phosphatase 168, ALT 27, AST 117, total protein 6.6, albumin 3.1, osmolality 237. Troponin less than 0.02. Urinalysis with specific gravity of 1.005, pH 5, negative leukocyte esterase, negative nitrite, RBC none, WBC 1 per high-power field, trace bacteria. WBC 5.6, hemoglobin 13.7, hematocrit 40.8, platelets 234, MCV 100. INR 1. D-dimer 3.81. EKG with sinus rate of 79. No ST elevation or depression. There is T wave inversion in aVR and V1.   ASSESSMENT AND PLAN: Sandra Vaughn is a pleasant 69 year old woman with history of liver cancer recently diagnosed and followed at [facility], peripheral vascular disease, carotid artery stenosis, hypertension, hyperlipidemia, gastroesophageal reflux disease, coronary artery disease, diet controlled diabetes presenting with reports of anorexia, weakness, shortness of breath, chest pain, edema.  1. Acute pancreatitis. Lipase 2588. Will continue to follow levels. Obtain an abdominal ultrasound and fasting lipid panel. Continue on clear diet. Hold off on IV fluids for now. Continue symptom control. Will obtain GI consultation to assist with management given her complex issues.  2. Hyponatremia, likely multifactorial with poor p.o. intake, Lasix, liver disease, and anasarca. Will send uric acid, TSH. Her urinalysis is as above. 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 Will initiate on fluid restriction. Monitor on tele. Follow sodium level closely every four hours and initiate on salt tabs for now.  3. Chest pain, atypical, questionable referred pain from pancreatitis, history of coronary disease and peripheral vascular disease. Will continue nitroglycerin sublingual as needed and oxygen. Continue on tele and cycle cardiac enzymes. Her D-dimer is elevated but is stable from January 2013 and V/Q scan at that time did show low probability for PE. Will hold off on additional studies especially with CT PE protocol. Will obtain an echocardiogram. Restart Toprol-XL, aspirin, and simvastatin.  4. Anasarca questionable in the setting of liver pathology. Her albumin is slightly low. Will obtain abdominal ultrasound as above and also get echocardiogram. Will start compression stockings and Lasix. Again, given her recent diagnosis of liver cancer it appears that it is non-treatable, unclear history. Will go ahead and obtain records from [facility] and get Palliative Care consultation as patient may benefit from Hospice.  5. Hypertension. Resume Cozaar and Toprol-XL.  6. Transaminitis. Again, recent diagnosis of liver cancer. Levels are stable. Again, will obtain records from [facility]. 7. Diet controlled diabetes. Sliding scale insulin and send A1c.  8. Prophylaxis with Lovenox, aspirin, and Protonix.    TIME SPENT: Approximately 55 minutes spent on patient care.   ____________________________ Reuel Derby, MD ap:drc D: 10/25/2011 01:06:05 ET T: 10/25/2011 07:03:22 ET JOB#:  [id number]  cc: Reuel Derby, MD, <Dictator> Sarah "Sallie" Allena Katz, MD Reuel Derby MD ELECTRONICALLY SIGNED 11/10/2011 22:37

## 2014-10-06 NOTE — Consult Note (Signed)
Chief Complaint:   Subjective/Chief Complaint Patient is still c/o waves of sharp pain. The pain is very vague and may start in RUQ or upper chest and radiates to upper chest, shoulder and sometimes to LLQ and legs. US showed changes suggestive of cirrhosis. Normal CBD and no evidence of biliary pathology.   VITAL SIGNS/ANCILLARY NOTES: **Vital Signs.:   05-Jan-13 05:42   Vital Signs Type Routine   Temperature Temperature (F) 97.7   Celsius 36.5   Temperature Source oral   Pulse Pulse 87   Pulse source per Dinamap   Respirations Respirations 18   Systolic BP Systolic BP 622   Diastolic BP (mmHg) Diastolic BP (mmHg) 80   Mean BP 99   BP Source Dinamap   Pulse Ox % Pulse Ox % 95   Pulse Ox Activity Level  At rest   Oxygen Delivery Room Air/ 21 %   Brief Assessment:   Additional Physical Exam Tender over right lower ribs.   Routine Chem:  05-Jan-13 05:23    Glucose, Serum 79   BUN 4   Creatinine (comp) 0.98   Sodium, Serum 140   Potassium, Serum 4.2   Chloride, Serum 106   CO2, Serum 25   Calcium (Total), Serum 9.5  Hepatic:  05-Jan-13 05:23    Bilirubin, Total 0.4   Alkaline Phosphatase 75   SGPT (ALT) 29   SGOT (AST) 77   Total Protein, Serum 6.9   Albumin, Serum 3.4  Routine Chem:  05-Jan-13 05:23    Osmolality (calc) 275   eGFR (African American) >60   eGFR (Non-African American) >60   Anion Gap 9   Radiology Results: [place]:    05-Jan-13 08:34, US Abdomen General Survey   US Abdomen General Survey    REASON FOR EXAM:    abdominal pain  COMMENTS:       PROCEDURE: [place]  - US ABDOMEN GENERAL SURVEY  - Jun 19 2011  8:34AM     RESULT: Abdominal sonogram demonstrates the proximal aorta appears   normal. The mid to distal aorta is obscured by bowel gas. Limited   visualization of the pancreas shows no discrete mass or ductal dilation.   Portal venous flow appears grossly normal. The left lobe of the liver   appears to be slightly prominent without focal mass. The  echotexture is   somewhat heterogeneous. Is there history of hepatitis or cirrhosis?   Correlate with clinical and laboratory data. Right kidney length measures   up to 11.66 cm area there is no obstruction area left kidney length is   12.49 cm without obstruction. The spleen is normal. The portal venous   flow is normal. The common bile duct diameter is normal at 5.9 mm. The     inferior vena cava appears patent.    No ascites or other abnormal fluid collection is visualized.    IMPRESSION:   1. History of cholecystectomy.  2. Slightly prominent left lobe of the liver with a heterogeneous   echotexture. The margins are slightly irregular. No discrete mass is   appreciated. Correlate with clinical and laboratory data. Incomplete   visualization of the aorta and pancreas. The visualized portions are   unremarkable.          Verified By: Sundra Aland, M.D., MD   Assessment/Plan:  Assessment/Plan:   Assessment Right sided abdominal pain of uncertain etiology. Most likely musculoskeletal. MRI thoracic spine showed DJD but no major lesions. ? Cirrhosis. LFT's are normal except  for mild elevation of AST. Hepatitis panel is pending. Patient denies any h/o prior liver disease. Abnormal non contrast CT of abdomen raising concerns about lesions within the right lobe of liver.    Plan Continue present management.  Contrast enhanced CT to further evaluate the suspected hepatic lesions as well as for better evaluation of splanchnic vasculature.   Electronic Signatures: Jill Side (MD)  (Signed 05-Jan-13 10:57)  Authored: Chief Complaint, VITAL SIGNS/ANCILLARY NOTES, Brief Assessment, Lab Results, Radiology Results, Assessment/Plan   Last Updated: 05-Jan-13 10:57 by Jill Side (MD)

## 2014-10-06 NOTE — Discharge Summary (Signed)
PATIENT NAME:  Sandra Vaughn, Sandra Vaughn MR#:  [id number] DATE OF BIRTH:  [birth year]  DATE OF ADMISSION:  11/12/2011 DATE OF DISCHARGE:  11/20/2011   TYPE OF DISCHARGE: Transfer to Hospice home   DISCHARGE DIAGNOSES: 1. Liver cancer with abnormal liver function tests, poor prognosis, discharged to Hospice home. 2. Hypertension. 3. Peripheral vascular disease. 4. Coronary artery disease. 5. Diabetes mellitus. 6. Hyponatremia, which is chronic. 7. Bilateral lower extremity cellulitis.  DISCHARGE MEDICATIONS: 1. Roxanol 20 mg/mL 0.5 to 1.0 every 1 to 2 hours for pain. 2. Ativan 1 mg every 2 to 4 hours p.r.n. anxiety. 3. Ranitidine 150 mg p.o. b.i.d. 4. Neurontin 300 mg one tablet in the morning, one at lunch, two at bedtime. 5. Omeprazole 20 mg by mouth twice daily. 6. Aspirin 325 mg p.o. daily. 7. Zofran 4 mg every 6 hours as needed. 8. Ferrous sulfate 325 mg p.o. daily. 9. Toprol XL 100 mg extended-release daily. 10. Augmentin 875 mg p.o. b.i.d. x2 days. 11. Torsemide 20 mg in the morning, 10 mg at night. 12. Hydralazine 25 mg p.o. t.i.d.  CONSULTATIONS: Palliative Care, Dr. Harriett SineNancy Phifer   HOSPITAL COURSE:  1. The patient is a 69 year old female with history of liver cancer, anasarca, hypertension, diabetes, and hyponatremia who was [facility] and found to have a sodium of 119 and requested admission but the patient wanted to come to [place]. She was admitted for hyponatremia. She also had bilateral lower extremity cellulitis. The patient initially was on Vancomycin and Ancef and changed to Keflex. Her leg dvt study was negative. She was discharged to Hospice. leg cellulitis  i,mproved2. Hyponatremia, chronic. The patient does have baseline sodium of 124 which has improved. Initially she was on Lasix and Aldactone and that was held on admission. Sodium came to 125  subsequentlu 3. Hypertension. We added  toprol xl  and also hydralazine. Continue torsemide.  4. Diabetes. She is on sliding  scale coverage and her hemoglobin A1c was 4.2.  The patient decided to go to the Hospice home and she was discharged to the Hospice home in stable condition.   TIME SPENT ON DISCHARGE: More than 30 minutes.   ____________________________ Katha HammingSnehalatha Oisin Yoakum, MD sk:drc D: 11/24/2011 06:57:00 ET T: 11/24/2011 10:32:36 ET JOB#: [id number]  cc: Katha HammingSnehalatha Rainn Zupko, MD, <Dictator> Katha HammingSNEHALATHA Linde Wilensky MD ELECTRONICALLY SIGNED 11/26/2011 14:15

## 2014-10-06 NOTE — H&P (Signed)
PATIENT NAME:  Sandra Vaughn, Sandra Vaughn MR#:  [id number] DATE OF BIRTH:  10-24-1945  DATE OF ADMISSION:  07/01/2011  PRIMARY CARE PHYSICIAN: Maralyn Sago "Maryruth Hancock, MD   CHIEF COMPLAINT: Chest and abdominal pain.   HISTORY OF PRESENT ILLNESS: The patient is a 69 year old female who was recently in the hospital with vomiting and abdominal pain. She had a work-up and GI consultation at that time. It was thought that some of the pain may be related to her cirrhosis and also to some rib pain that was musculoskeletal. She was sent home with pain medications. Two days ago she started having return of this pain, more in the midsternal area. The pain happens with deep inspiration and also just sitting there. She has had nausea and had poor p.o. intake. She comes in and she is dehydrated and uncomfortable. The ER ordered a D-dimer which is elevated at over 4, so a V/Q scan is pending. She also has some acute renal failure, so we are going to go ahead and admit her to the hospital for further treatment.   PAST MEDICAL HISTORY:  1. Recent abdominal pain with work-up.  2. Hypertension.  3. Neuropathy.  4. Gastroesophageal reflux disease.  5. Hyperlipidemia.  6. Non-insulin-dependent diabetes.  7. Coronary artery disease.   PAST SURGICAL HISTORY:  1. Hysterectomy.  2. Hernia repair.  3. Cholecystectomy.   ALLERGIES: Adenosine, codeine, penicillin and sulfa.   CURRENT MEDICATIONS:  1. Premarin cream 3 times a week.  2. Norco 7.5/500 every 6 hours p.r.n.  3. Singulair 10 mg at bedtime.  4. Omeprazole 20 mg daily.  5. Neurontin 300 mg t.i.d. and at bedtime.  6. Simvastatin 80 mg at bedtime.  7. Cozaar 100 mg daily.  8. Lasix 40 mg daily.  9. Procardia XL 60 mg daily.  10. Aspirin 325 mg daily.  11. Calcium plus vitamin D b.i.d.  12. Multivitamin daily.  13. Potassium 10 mEq b.i.d.  14. Toprol-XL 50 mg daily.  15. Glucophage 500 mg b.i.d.  16. Zofran 4 mg every 6 hours p.r.n.   SOCIAL HISTORY: She does  not smoke, does not drink alcohol.   FAMILY HISTORY: Significant for coronary artery disease.    REVIEW OF SYSTEMS: CONSTITUTIONAL: No fever or chills. EYES: No blurred vision. ENT: No hearing loss. CARDIOVASCULAR: Some chest pain. PULMONARY: No shortness of breath. GI: She had nausea, vomiting, abdominal pain. GU: No dysuria. ENDOCRINE: No heat or cold intolerance. INTEGUMENTARY: No rash. MUSCULOSKELETAL: Occasional joint pain. NEUROLOGICAL: No numbness or weakness.   PHYSICAL EXAMINATION:  VITAL SIGNS: Temperature is 97.8, pulse 100, respirations 20, blood pressure 132/67.   GENERAL: This is a well-nourished black female who appears uncomfortable.   HEENT: The pupils are equal, round, and reactive to light. Sclerae are anicteric. Oral mucosa is moist. Oropharynx is clear. Nasopharynx is clear.   NECK: Supple. No JVD, lymphadenopathy. No thyromegaly.   CARDIOVASCULAR: Regular rate and rhythm. No murmurs, rubs, or gallops.   LUNGS: Clear to auscultation. No dullness to percussion. She is not using accessory muscles.   ABDOMEN: Soft and nondistended. Bowel sounds are positive. No hepatosplenomegaly. She is tender in the right upper quadrant and on the midsternal area. No rebound or guarding.   EXTREMITIES: There is no edema. No joint deformity.   NEUROLOGIC: Cranial nerves II through XII are intact. She is alert and oriented x4.   SKIN: Moist with no rash.   LABORATORY, DIAGNOSTIC AND RADIOLOGICAL DATA:  White blood cells are 10.8, hemoglobin 12.9. BUN  is 19, creatinine is 1.41, sodium 137, potassium 5.1.  Troponin is less than 0.02.  D-dimer is 4.39.   ASSESSMENT AND PLAN:  1. Chest pain with elevated D-dimer: We need to rule out PE. With her renal function being elevated, we cannot use a contrasted CT; so we will go ahead and get a V/Q scan and it  is pending at this point. With her history of coronary artery disease, too, even though this does seem to be more musculoskeletal or  from some other cause, and with the elevated D-dimer, I am going to go ahead and put her on anticoagulation for now pending results of test. I will go ahead and get cardiac enzymes also.  2. Abdominal pain: She said this is very similar to what she just had. The etiology is still unknown. I am going to give her more pain medicine to make her more comfortable. I have to wonder if this is from her cirrhosis or could this be worsening neuropathy. We will continue to evaluate and work her up.  3. Acute renal failure:  This is from poor p.o. intake from her nausea. We will give her IV fluids.  4. Nausea: Keep her n.p.o. for now, give her antiemetics.  5. Non-insulin-dependent diabetes: We will continue to hold her Glucophage because of her elevated renal function, and we will go ahead and give her sliding scale insulin for now.   TIME SPENT ON ADMISSION: 45 minutes.  ____________________________ Gracelyn NurseJohn D. Blakleigh Straw, MD jdj:cbb D: 07/01/2011 21:25:47 ET T: 07/02/2011 06:38:49 ET JOB#: [id number] cc: Maralyn SagoSarah "Maryruth HancockSallie" Patel, MD Gracelyn NurseJOHN D Sanja Elizardo MD ELECTRONICALLY SIGNED 07/05/2011 9:07

## 2014-10-06 NOTE — Consult Note (Signed)
PATIENT NAME:  Sandra Vaughn, Sandra Vaughn MR#:  [id number] DATE OF BIRTH:  1945-11-10  DATE OF CONSULTATION:  10/25/2011  REFERRING PHYSICIAN:  Alounthith Phichith, MD CONSULTING PHYSICIAN:  Lurline Del, MD  REASON FOR CONSULTATION: Acute pancreatitis.   HISTORY OF PRESENT ILLNESS: The patient is a 69 year old female with history of hypertension, gastroesophageal reflux disease, hyperlipidemia, coronary artery disease, and peripheral vascular disease. The patient was apparently diagnosed with liver cancer in February of 2013 at [facility]. The patient has not received any treatment. It appears to me from discussion with the family that the diagnosis was probably metastatic disease to the liver of unknown primary. The patient was admitted to the hospital today with increasing leg swelling and swelling of the lower part of the abdomen. Her lipase was elevated to more than 2000 and a diagnosis of acute pancreatitis was made and gastroenterology was consulted. The patient herself denies any significant abdominal pain. She denies any nausea or vomiting. According to one family member, the patient had an attack of pancreatitis in January of 2013 as well. That attack prompted further workup and that is how she was diagnosed with metastatic liver cancer. The patient feels poorly with poor oral intake, anorexia, and generalized weakness. No other significant symptoms were reported by the patient.   PAST MEDICAL HISTORY:  1. Probable metastatic cancer to the liver, at [facility]. Details and [facility] records are not available.  2. Hypertension. 3. Neuropathy. 4. Gastroesophageal reflux disease.  5. Hyperlipidemia.  6. Diet-controlled diabetes. 7. Coronary artery disease, status post myocardial infarction. 8. Peripheral vascular disease.   PAST SURGICAL HISTORY:  1. Liver biopsy. 2. Left renal artery stent.  3. Hysterectomy.  4. Hernia repair. 5. Cholecystectomy. 6. Cesarean section.   ALLERGIES: Adenosine, codeine,  penicillin, sulfa, and latex.   HOME MEDICATIONS: Premarin cream, hydrocodone, iron supplement, Singulair, omeprazole, Neurontin, simvastatin, Cozaar, Lasix, aspirin, calcium, multivitamin, Toprol-XL, and Zofran.   SOCIAL HISTORY: She lives alone.   FAMILY HISTORY: Positive for coronary artery disease.   REVIEW OF SYSTEMS: Positive for generalized weakness, bilateral pedal edema, and anorexia.  PHYSICAL EXAMINATION:   GENERAL: Short, obese female. She appears weak and tired but does not appear to be in any acute distress.   VITAL SIGNS: Vitals are fairly stable. She is afebrile, pulse is in the 70s, and blood pressure is about 120/75.   HEENT: Clinically she is not jaundice.   NECK: Neck veins are flat.   LUNGS: Grossly clear to auscultation.   HEART: Regular rate and rhythm.   ABDOMEN: Examination showed somewhat distended abdomen. Bowel sounds are positive. A mass was noted in the epigastrium to left upper quadrant area which appears to be the left lobe of the liver, although splenic enlargement cannot be ruled out. Questionable shifting dullness.   EXTREMITIES: Examination shows 2 to 3+ pitting edema bilaterally.   NEUROLOGIC: Exam appears to be unremarkable.   LABS/STUDIES: Troponins are normal. Serum sodium is very low at 116. TSH is 5.65. Hemoglobin is 13.7, white cell count 5.6, and platelet count 234. INR is 1.   Urinalysis is unremarkable.   Ultrasound of the abdomen showed enlarged nonhomogeneous liver with progressive nodularity consistent with metastatic disease, mild enlargement of the spleen, prior cholecystectomy, and a small amount of ascites.   ASSESSMENT AND RECOMMENDATIONS:  1. The patient is with generalized anasarca, especially bilateral pedal edema, and some abdominal swelling. It may be due to her liver disease, although other causes of fluid retention such as congestive heart failure needs  to be ruled out.  2. The patient also has what appears to be acute  pancreatitis. The pancreas was not visualized on ultrasound. Lipase is elevated, although the patient does not have typical signs and symptoms of acute pancreatitis. I would treat it as acute pancreatitis with clear liquid diet and then gradually advancing the diet as tolerated. The patient denies significant abdominal pain.  3. Probable metastatic disease to the liver. [facility] not available, although it appears that we are probably dealing with metastatic stage IV cancer. According to the patient, she has had an EGD and a colonoscopy about a year ago and those tests were unremarkable. We will continue to treat conservatively. The patient will probably be a likely candidate for hospice and comfort care. We will [facility] and make further recommendations.  ____________________________ Lurline DelShaukat Alisen Marsiglia, MD si:slb D: 10/25/2011 17:05:55 ET T: 10/26/2011 09:35:50 ET JOB#: [id number]  cc: Lurline DelShaukat Jaquari Reckner, MD, <Dictator> Lurline DelSHAUKAT Carlee Vonderhaar MD ELECTRONICALLY SIGNED 11/03/2011 9:03

## 2014-10-06 NOTE — Consult Note (Signed)
Brief Consult Note: Diagnosis: Abdominal pain.   Patient was seen by consultant.   Consult note dictated.   Discussed with Attending MD.   Comments: Abdominal pain most likely musculoskeletal. Doubt acute pancreatitis. Abnormal liver on non contrast CT. Nausea with occassional vomiting.  Recommendations: Agree with US and hepatits profile. Patient will ikely need a contrast enhanced CT for further evaluation of abnormal liver. Will review the resuts of US and make further recommendations. Thanks.  Electronic Signatures: Lurline DelIftikhar, Lenton Gendreau (MD)  (Signed 04-Jan-13 20:38)  Authored: Brief Consult Note   Last Updated: 04-Jan-13 20:38 by Lurline DelIftikhar, Christin Moline (MD)

## 2014-10-06 NOTE — Discharge Summary (Signed)
PATIENT NAME:  Sandra Vaughn, Sandra Vaughn MR#:  [id number] DATE OF BIRTH:  04-16-1946  DATE OF ADMISSION:  10/25/2011 DATE OF DISCHARGE:  11/01/2011  ADMITTING DIAGNOSIS: Acute pancreatitis.   DISCHARGE DIAGNOSES:  1. Acute pancreatitis.  2. Hyponatremia.  3. Anasarca.  4. Hypertension, poorly controlled.  5. Hyperkalemia likely related to Cozaar which is stopped now.  6. Diabetes mellitus, diet controlled with hemoglobin A1c 4.5 on 10/26/2011.  7. Bilateral lower extremity pain. No DVT on ultrasound.  8. Skin ulceration in right popliteal area due to TED stockings. No infection. DuoDERM applied.  9. History of peripheral vascular disease. 10. Carotid stenosis.  11. Liver cancer. 12. History of hypertension.  13. Hyperlipidemia.  14. Gastroesophageal reflux disease. 15. Coronary artery disease. 16. Abnormal echocardiogram with septal hypokinesis on echo done in May 2013.  17. Obesity.   DISCHARGE CONDITION: Guarded.   DISCHARGE MEDICATIONS: The patient is to resume her outpatient medications which are:  1. Singulair 10 mg p.o. at bedtime.  2. Patanol 0.1% ophthalmic solution one drop to each affected eye twice daily.  3. Triamcinolone acetonide 0.1% cream. Apply to affected area twice daily for up to two weeks as needed.  4. Omeprazole 20 mg p.o. twice daily.  5. Neurontin 300 mg p.o. in the morning as well as at lunchtime and 600 mg p.o. at bedtime.  6. Aspirin 325 mg p.o. daily.  7. Calcium with Vitamin D 600 mg/400 units 1 tablet twice daily.  8. Multivitamin 1 tablet once daily.  9. Zofran 4 mg p.o. every six hours as needed.  10. Tessalon Perles 100 mg p.o. every six hours as needed.  11. Simvastatin 40 mg p.o. at bedtime.  12. Nitroglycerin 0.4 mg sublingually every five minutes as needed for chest pains. 13. Iron sulfate 325 mg p.o. daily.  14. Acetaminophen 325 mg/oxycodone 10 mg every four hours as needed.  15. Lasix 80 mg p.o. twice daily.  16. Magnesium oxide 400 mg p.o.  twice daily.  17. Toprol-XL 100 mg p.o. daily.   DO NOT TAKE: Cozaar or Glucophage unless recommended by primary care physician.   HOME OXYGEN: None.   DIET: 2 gram salt, 1800 ADA, low fat, low cholesterol, low water intake, only as needed.   ACTIVITY LIMITATIONS: As tolerated.   REFERRAL: Hospice at home.    FOLLOW-UP: Follow-up appointment with Dr. Maryruth Vaughn in the next 1 to 2 days after discharge.   CONSULTANTS:  1. Social Work  2. Palliative Care  3. Dr. Niel Hummer   RADIOLOGIC STUDIES:  1. Chest x-ray, portable single view, 10/24/2011 showed atelectasis versus scarring in right lung base with chronic mild elevation of hemidiaphragm. Otherwise, no acute abnormality.  2. Ultrasound of abdomen, general survey, 10/25/2011, showed liver which is not enlarged, nonhomogeneous, and shows progressive nodularity consistent with diffuse metastatic hepatic disease. There is mild enlargement of the spleen. The patient had prior cholecystectomy. Small amount of ascites noted inferior to the liver. 3. Bilateral lower extremity Doppler's on 10/27/2011 showed bilateral normal study. No deep vein thrombosis was identified on either side.  4. Chest, PA and lateral, 10/31/2011, showed findings which likely represent discoid atelectasis in the right lower lobe. Follow-up radiographs are recommended to ensure resolution.   REASON FOR ADMISSION: The patient is a 69 year old African American female with past medical history significant for history of recent diagnosis of liver cancer in February 2013 via liver biopsy who presented to the hospital with complaints of shortness of breath, chest pains, edema, weakness, as  well as anorexia. Please refer to Dr. Darden Amber admission note on 10/25/2011.   PHYSICAL EXAMINATION: On admission to the hospital, the patient's temperature was 97.2, pulse was 81, respiratory rate 28, blood pressure 186/77, saturation was 98% on room air. Physical exam was remarkable for 3+  pitting edema all the way to the thigh. Abdomen had edema as well as questionable ascites but no masses were appreciated.   LABORATORY, DIAGNOSTIC, AND RADIOLOGICAL DATA: The patient's EKG showed normal sinus rhythm at 79 beats per minute, no ST-T elevations or depressions, however, T wave inversion in aVR as well as V1 were appreciated on admission.  The patient's admission labs done 10/24/2011 showed markedly decreased sodium of 116, glucose 103. The patient's CO2 level was 20. Chloride was 80. The patient's lipase level was elevated to 2588. Liver enzymes showed albumin level of 3.1, alkaline phosphatase 168, AST 117. Cardiac enzymes, first set as well as subsequent three more sets, were within normal limits. TSH was high at 5.65. CBC was within normal limits with hemoglobin level of 13.7 on 10/24/2011, white blood cell count 5.6 and platelet count 234. Coagulation panel was normal, however, the patient's D-dimer was high at 3.81. Urinalysis was unremarkable.   HOSPITAL COURSE: The patient was admitted to the hospital. 1. In regards to acute pancreatitis, initially the patient was started on n.p.o. regimen, however, with conservative therapy the patient's condition improved and she was able to eat a regular diet on day of discharge. Her lipase level also improved as well as her abdominal pain discomfort.  2. In regards to hyponatremia, the patient was given salt tablets as well as was restricted with fluid intake and continued on Lasix. With this conservative therapy her condition improved and her sodium level also improved. The patient was advised to continue fluid restriction. She is to continue Lasix and follow-up BMP as outpatient. She needs to have creatinine as well as her sodium checked next week after discharge. Her sodium level is 124 on 10/31/2011 up from 116 on day of admission.  3. The patient had some chest pains. They were, however, atypical. She was initially managed on telemetry.  Echocardiogram was performed on 10/25/2011 which revealed normal chamber size and normal left ventricular systolic function with mild diastolic dysfunction as well as mild MR. Septal hypokinesis was suggestive of coronary artery disease according to Dr. Adrian Blackwater who read echocardiogram. As the patient had no recurrent chest pains, it was felt that she is to continue her usual outpatient management with beta-blockers and simvastatin as well as aspirin.   4. In regards to anasarca, the patient was noted to have severe swelling of her lower extremities. Initially compression stockings were placed on her legs, however, they blistered and compressed her legs so badly that even she had blister in the posterior aspect of her leg on the right, however, that blister was not infected. She was continued on Lasix after compression stockings were taken off and was diuresed well. Over her stay in the hospital time she lost approximately 4100 mL of fluid. She was advised to continue high doses of Lasix. Initially she was started on 40 mg p.o. twice daily dose, however, that did not work for her well so Lasix was increased to 80 mg p.o. twice daily dose. She also had Unna boots placed prior to discharge. She is being discharged home with Hospice care at home. She is to follow-up with her primary care physician and make decisions about advancement of her Lasix even higher  if she is not responding to 80 mg twice daily dose adequately.  5. In regards to hypertension, the patient's blood pressure was poorly controlled. Initially it was low, however, later it was higher when blood pressure medications were stopped. At the day of discharge, however, we were able to advance the patient's Toprol-XL to 100 mg p.o. daily dose and her blood pressure was good. The patient's vital signs on day of discharge revealed temperature 98.4, pulse 89, respiration rate 18, blood pressure 157/87, saturation 98% on room air at rest. It is recommended  to follow the patient's blood pressure readings and make decisions about advancement of her blood pressure medications even higher if needed.  6. In regards to history of metastatic liver cancer, [facility]. She is to continue follow-up with physicians over there. She was seen by Dr. Niel HummerIftikhar while she was here in the hospital who followed her along. He, however, recommended no further GI recommendations for metastatic liver disease. The patient was recommended to follow-up with [facility] after discharge. Dr. 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 He felt there is an extremely poor prognosis and recommended Hospice care. The patient was evaluated by Palliative Care and agreed to Hospice care at home. She is going to be discharged home with Hospice care at home. Family initially was concerned about her ability to take care of herself at home, however, later with discussions with Hospice that was straightened up and family will be involved in the patient's care and they were agreeable to that.  7. In regards to peripheral vascular disease, history of hyperlipidemia, gastroesophageal reflux disease, as well as obesity, the patient is to continue her outpatient medications. No changes were made here.   DISPOSITION: The patient is being discharged in stable condition with the above-mentioned medications and follow-up.   TIME SPENT: 40 minutes.   ____________________________ Katharina Caperima Aaryn Sermon, MD rv:drc D: 11/01/2011 14:59:06 ET T: 11/02/2011 10:56:37 ET JOB#: [id number]  cc: Katharina Caperima Yaeko Fazekas, MD, <Dictator> Sarah "Sallie" Allena KatzPatel, MD Sadeen Wiegel MD ELECTRONICALLY SIGNED 11/13/2011 15:22

## 2014-10-06 NOTE — Consult Note (Signed)
Chief Complaint:   Subjective/Chief Complaint Tolerating full liquid well. NO GI complaints but still c/o weakness and led swelling.   VITAL SIGNS/ANCILLARY NOTES: **Vital Signs.:   18-May-13 07:52   Vital Signs Type Pre Medication   Temperature Temperature (F) 98.2   Celsius 36.7   Temperature Source oral   Pulse Pulse 80   Pulse source per Dinamap   Respirations Respirations 18   Systolic BP Systolic BP 627   Diastolic BP (mmHg) Diastolic BP (mmHg) 82   Mean BP 108   BP Source Dinamap   Pulse Ox % Pulse Ox % 99   Pulse Ox Activity Level  At rest   Oxygen Delivery Room Air/ 21 %   Brief Assessment:   Additional Physical Exam 3 + bilateral pedal edema. Positive abdominal distension.   Routine Chem:  18-May-13 05:50    Glucose, Serum 58   BUN 9   Creatinine (comp) 0.81   Sodium, Serum 122   Potassium, Serum 5.0   Chloride, Serum 86   CO2, Serum 19   Calcium (Total), Serum 8.8   Osmolality (calc) 242   eGFR (African American) >60   eGFR (Non-African American) >60   Anion Gap 17  Routine Hem:  18-May-13 05:50    Platelet Count (CBC) 152  Routine Chem:  18-May-13 05:50    Magnesium, Serum 1.5  Blood Glucose:  18-May-13 07:30    POCT Blood Glucose 82    11:15    POCT Blood Glucose 104   Assessment/Plan:  Assessment/Plan:   Assessment Acute pancreatitis, improving. 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 Extremely poor prognosis.    Plan Agree with advancing to soft diet. She should consider hospice. No further Gi recommendations. Patient to follow with [facility] after discharge. Will sign off. Please call me if needed. Thanks.   Electronic Signatures: Jill Side (MD)  (Signed [id number] 15:47)  Authored: Chief Complaint, VITAL SIGNS/ANCILLARY NOTES, Brief Assessment, Lab Results, Assessment/Plan   Last Updated: 18-May-13 15:47 by Jill Side (MD)
